# Patient Record
Sex: Female | Born: 1940 | Race: White | Hispanic: No | Marital: Married | Smoking: Never smoker
Health system: Southern US, Community
[De-identification: ages and names within clinical notes are randomized; demographics above are authoritative.]

## PROBLEM LIST (undated history)

## (undated) DIAGNOSIS — I10 Essential (primary) hypertension: Secondary | ICD-10-CM

## (undated) DIAGNOSIS — F419 Anxiety disorder, unspecified: Secondary | ICD-10-CM

## (undated) DIAGNOSIS — M545 Low back pain, unspecified: Secondary | ICD-10-CM

## (undated) DIAGNOSIS — M1990 Unspecified osteoarthritis, unspecified site: Secondary | ICD-10-CM

## (undated) DIAGNOSIS — K219 Gastro-esophageal reflux disease without esophagitis: Secondary | ICD-10-CM

## (undated) DIAGNOSIS — N200 Calculus of kidney: Secondary | ICD-10-CM

## (undated) DIAGNOSIS — Z8719 Personal history of other diseases of the digestive system: Secondary | ICD-10-CM

## (undated) DIAGNOSIS — Z8711 Personal history of peptic ulcer disease: Secondary | ICD-10-CM

## (undated) DIAGNOSIS — I639 Cerebral infarction, unspecified: Secondary | ICD-10-CM

## (undated) DIAGNOSIS — K529 Noninfective gastroenteritis and colitis, unspecified: Secondary | ICD-10-CM

## (undated) DIAGNOSIS — J3800 Paralysis of vocal cords and larynx, unspecified: Secondary | ICD-10-CM

## (undated) DIAGNOSIS — E039 Hypothyroidism, unspecified: Secondary | ICD-10-CM

## (undated) DIAGNOSIS — M797 Fibromyalgia: Secondary | ICD-10-CM

## (undated) DIAGNOSIS — C44321 Squamous cell carcinoma of skin of nose: Secondary | ICD-10-CM

## (undated) DIAGNOSIS — G43909 Migraine, unspecified, not intractable, without status migrainosus: Secondary | ICD-10-CM

## (undated) DIAGNOSIS — A048 Other specified bacterial intestinal infections: Secondary | ICD-10-CM

## (undated) DIAGNOSIS — G8929 Other chronic pain: Secondary | ICD-10-CM

## (undated) DIAGNOSIS — M069 Rheumatoid arthritis, unspecified: Secondary | ICD-10-CM

## (undated) HISTORY — PX: KYPHOPLASTY: SHX5884

## (undated) HISTORY — DX: Other specified bacterial intestinal infections: A04.8

## (undated) HISTORY — PX: EXCISIONAL HEMORRHOIDECTOMY: SHX1541

## (undated) HISTORY — PX: TONSILLECTOMY: SUR1361

## (undated) HISTORY — PX: LUMBAR DISC SURGERY: SHX700

## (undated) HISTORY — PX: SQUAMOUS CELL CARCINOMA EXCISION: SHX2433

## (undated) HISTORY — PX: LAPAROSCOPIC CHOLECYSTECTOMY: SUR755

## (undated) HISTORY — PX: CATARACT EXTRACTION W/ INTRAOCULAR LENS  IMPLANT, BILATERAL: SHX1307

## (undated) HISTORY — PX: ABDOMINAL HYSTERECTOMY: SHX81

## (undated) HISTORY — DX: Paralysis of vocal cords and larynx, unspecified: J38.00

## (undated) HISTORY — PX: LITHOTRIPSY: SUR834

---

## 2011-02-22 DIAGNOSIS — E559 Vitamin D deficiency, unspecified: Secondary | ICD-10-CM | POA: Insufficient documentation

## 2011-08-23 DIAGNOSIS — M810 Age-related osteoporosis without current pathological fracture: Secondary | ICD-10-CM | POA: Insufficient documentation

## 2012-03-01 ENCOUNTER — Emergency Department (HOSPITAL_BASED_OUTPATIENT_CLINIC_OR_DEPARTMENT_OTHER): Payer: Medicare Other

## 2012-03-01 ENCOUNTER — Encounter (HOSPITAL_BASED_OUTPATIENT_CLINIC_OR_DEPARTMENT_OTHER): Payer: Self-pay | Admitting: *Deleted

## 2012-03-01 ENCOUNTER — Emergency Department (HOSPITAL_BASED_OUTPATIENT_CLINIC_OR_DEPARTMENT_OTHER)
Admission: EM | Admit: 2012-03-01 | Discharge: 2012-03-01 | Disposition: A | Discharge status: Home / Self Care | Payer: Medicare Other | Attending: Emergency Medicine | Admitting: Emergency Medicine

## 2012-03-01 DIAGNOSIS — Y929 Unspecified place or not applicable: Secondary | ICD-10-CM | POA: Insufficient documentation

## 2012-03-01 DIAGNOSIS — E079 Disorder of thyroid, unspecified: Secondary | ICD-10-CM | POA: Insufficient documentation

## 2012-03-01 DIAGNOSIS — K5289 Other specified noninfective gastroenteritis and colitis: Secondary | ICD-10-CM | POA: Insufficient documentation

## 2012-03-01 DIAGNOSIS — M1990 Unspecified osteoarthritis, unspecified site: Secondary | ICD-10-CM | POA: Insufficient documentation

## 2012-03-01 DIAGNOSIS — Z79899 Other long term (current) drug therapy: Secondary | ICD-10-CM | POA: Insufficient documentation

## 2012-03-01 DIAGNOSIS — S93609A Unspecified sprain of unspecified foot, initial encounter: Secondary | ICD-10-CM

## 2012-03-01 DIAGNOSIS — I10 Essential (primary) hypertension: Secondary | ICD-10-CM | POA: Insufficient documentation

## 2012-03-01 DIAGNOSIS — M069 Rheumatoid arthritis, unspecified: Secondary | ICD-10-CM | POA: Insufficient documentation

## 2012-03-01 DIAGNOSIS — X500XXA Overexertion from strenuous movement or load, initial encounter: Secondary | ICD-10-CM | POA: Insufficient documentation

## 2012-03-01 DIAGNOSIS — Y9389 Activity, other specified: Secondary | ICD-10-CM | POA: Insufficient documentation

## 2012-03-01 HISTORY — DX: Unspecified osteoarthritis, unspecified site: M19.90

## 2012-03-01 HISTORY — DX: Rheumatoid arthritis, unspecified: M06.9

## 2012-03-01 HISTORY — DX: Essential (primary) hypertension: I10

## 2012-03-01 HISTORY — DX: Noninfective gastroenteritis and colitis, unspecified: K52.9

## 2012-03-01 MED ORDER — ACETAMINOPHEN 325 MG PO TABS
650.0000 mg | ORAL_TABLET | Freq: Once | ORAL | Status: AC
  Administered 2012-03-01: 650 mg via ORAL
  Filled 2012-03-01: qty 2

## 2012-03-01 MED ORDER — HYDROCODONE-ACETAMINOPHEN 5-500 MG PO TABS: 1.0000 | ORAL_TABLET | Freq: Four times a day (QID) | ORAL | Status: DC | PRN

## 2012-03-01 NOTE — ED Notes (Signed)
Patient states she steped down off a ladder and twisted her left ankle. Continued to hurt, hurts to bear weight. Took tylenol around 3am.

## 2012-03-01 NOTE — ED Provider Notes (Signed)
History     CSN: [id number]  Arrival date & time 03/01/12  4098   First MD Initiated Contact with Patient 03/01/12 [phone]      Chief Complaint  Patient presents with  . Ankle Pain    (Consider location/radiation/quality/duration/timing/severity/associated sxs/prior treatment) HPI Comments: Patient presents with pain and swelling to the left foot. She states she was stepping off a ladder and when she stepped down onto her left foot she felt a slight pop in the lateral aspect of the foot. This happened yesterday and she's had constant throbbing pain since then. She's unable to bear weight. She denies any other injuries. Denies any past injuries to that foot. She took a Tylenol earlier this morning with some relief.  Patient is a 71 y.o. female presenting with ankle pain.  Ankle Pain  Pertinent negatives include no numbness.    Past Medical History  Diagnosis Date  . Thyroid disease   . Hypertension   . Osteoarthritis   . Colitis   . Rheumatoid arthritis     Past Surgical History  Procedure Date  . Tonsillectomy   . Cholecystectomy   . Abdominal hysterectomy   . Back surgery   . Cesarean section     No family history on file.  History  Substance Use Topics  . Smoking status: Never Smoker   . Smokeless tobacco: Not on file  . Alcohol Use: No    OB History    Grav Para Term Preterm Abortions TAB SAB Ect Mult Living                  Review of Systems  Constitutional: Negative for fever.  HENT: Negative for neck pain.   Gastrointestinal: Negative for nausea and vomiting.  Musculoskeletal: Negative for back pain.  Skin: Negative for wound.  Neurological: Negative for weakness, numbness and headaches.    Allergies  Ciprofloxacin; Leflunomide; Macrodantin; Methotrexate derivatives; Penicillins; and Tetanus toxoids  Home Medications   Current Outpatient Rx  Name Route Sig Dispense Refill  . CEPHALEXIN 250 MG PO CAPS Oral Take 250 mg by mouth daily.    Marland Kitchen  VITAMIN D 1000 UNITS PO TABS Oral Take 2,000 Units by mouth daily.    Marland Kitchen DIAZEPAM 5 MG PO TABS Oral Take 5 mg by mouth every 6 (six) hours as needed.    Marland Kitchen ETANERCEPT 50 MG/ML [place] SOLN Subcutaneous Inject 50 mg into the skin once a week.    Marland Kitchen FOLIC ACID-VIT B6-VIT B12 2.2-25-0.5 MG PO TABS Oral Take by mouth.    Marland Kitchen HYDROCODONE-ACETAMINOPHEN 5-500 MG PO CAPS Oral Take 1 capsule by mouth every 6 (six) hours as needed.    Marland Kitchen HYDROXYCHLOROQUINE SULFATE 200 MG PO TABS Oral Take by mouth daily.    Marland Kitchen LEVOTHYROXINE SODIUM 75 MCG PO TABS Oral Take 75 mcg by mouth daily.    Marland Kitchen METOPROLOL TARTRATE 100 MG PO TABS Oral Take 100 mg by mouth daily.    . OXYCODONE-ACETAMINOPHEN 7.5-325 MG PO TABS Oral Take 1 tablet by mouth every 4 (four) hours as needed.    Marland Kitchen PANTOPRAZOLE SODIUM 40 MG PO TBEC Oral Take 40 mg by mouth daily.    Marland Kitchen RIFAXIMIN 200 MG PO TABS Oral Take 200 mg by mouth 3 (three) times daily.    Marland Kitchen ZOLEDRONIC ACID 5 MG/100ML IV SOLN Intravenous Inject 5 mg into the vein once.    Marland Kitchen HYDROCODONE-ACETAMINOPHEN 5-500 MG PO TABS Oral Take 1-2 tablets by mouth every 6 (six) hours as needed  for pain. 15 tablet 0    BP 126/67  Pulse 56  Temp 97.8 F (36.6 C) (Oral)  Resp 14  Ht 4\' 11"  (1.499 m)  Wt 109 lb (49.442 kg)  BMI 22.02 kg/m2  SpO2 100%  Physical Exam  Constitutional: She is oriented to person, place, and time. She appears well-developed and well-nourished.  HENT:  Head: Normocephalic and atraumatic.  Cardiovascular: Normal rate.   Pulmonary/Chest: Effort normal. No respiratory distress. She has no wheezes. She has no rales. She exhibits no tenderness.  Abdominal: There is no tenderness. There is no rebound and no guarding.  Musculoskeletal: Normal range of motion. She exhibits no edema.       Patient with moderate tenderness along the left fifth metatarsal. There is no pain to the ankle. No pain in the proximal fibula. She has normal sensation in the foot. Normal motor function in the foot.  Pulses in the foot are intact  Neurological: She is alert and oriented to person, place, and time.  Skin: Skin is warm and dry.  Psychiatric: She has a normal mood and affect.    ED Course  Procedures (including critical care time)  No results found for this or any previous visit. Dg Foot Complete Left  03/01/2012  *RADIOLOGY REPORT*  Clinical Data: 71 year old female with left foot pain following injury.  LEFT FOOT - COMPLETE 3+ VIEW  Comparison: None  Findings: There is no evidence of acute fracture, subluxation, or dislocation. The Lisfranc joints are intact. A moderate calcaneal spur is present. No other focal bony abnormalities are present. There is no evidence of radiopaque foreign body.  The joint spaces are unremarkable.  IMPRESSION: No evidence of acute abnormality.  Calcaneal spur.   Original Report Authenticated By: Rosendo Gros, M.D.       1. Foot sprain       MDM  No evidence of fracture.  Likely sprain.  Will place in post op shoe, has walker at home.  F/u with her PMD next week for recheck        Rolan Bucco, MD 03/01/12 [phone]

## 2012-03-10 DIAGNOSIS — E538 Deficiency of other specified B group vitamins: Secondary | ICD-10-CM | POA: Insufficient documentation

## 2012-03-18 DIAGNOSIS — D179 Benign lipomatous neoplasm, unspecified: Secondary | ICD-10-CM | POA: Insufficient documentation

## 2012-03-18 DIAGNOSIS — Z139 Encounter for screening, unspecified: Secondary | ICD-10-CM | POA: Insufficient documentation

## 2012-03-18 DIAGNOSIS — N182 Chronic kidney disease, stage 2 (mild): Secondary | ICD-10-CM | POA: Insufficient documentation

## 2012-03-18 DIAGNOSIS — D709 Neutropenia, unspecified: Secondary | ICD-10-CM | POA: Insufficient documentation

## 2014-06-03 DIAGNOSIS — N281 Cyst of kidney, acquired: Secondary | ICD-10-CM | POA: Insufficient documentation

## 2014-06-03 DIAGNOSIS — Z87442 Personal history of urinary calculi: Secondary | ICD-10-CM | POA: Insufficient documentation

## 2014-08-16 DIAGNOSIS — Z79899 Other long term (current) drug therapy: Secondary | ICD-10-CM | POA: Insufficient documentation

## 2014-09-01 ENCOUNTER — Inpatient Hospital Stay (HOSPITAL_BASED_OUTPATIENT_CLINIC_OR_DEPARTMENT_OTHER)
Admission: EM | Admit: 2014-09-01 | Discharge: 2014-09-02 | DRG: 641 | Disposition: A | Discharge status: Home Health | Payer: Medicare Other | Attending: Internal Medicine | Admitting: Internal Medicine

## 2014-09-01 ENCOUNTER — Emergency Department (HOSPITAL_BASED_OUTPATIENT_CLINIC_OR_DEPARTMENT_OTHER): Payer: Medicare Other

## 2014-09-01 ENCOUNTER — Encounter (HOSPITAL_BASED_OUTPATIENT_CLINIC_OR_DEPARTMENT_OTHER): Payer: Self-pay | Admitting: Emergency Medicine

## 2014-09-01 ENCOUNTER — Inpatient Hospital Stay (HOSPITAL_COMMUNITY): Payer: Medicare Other

## 2014-09-01 DIAGNOSIS — E860 Dehydration: Secondary | ICD-10-CM | POA: Diagnosis not present

## 2014-09-01 DIAGNOSIS — M069 Rheumatoid arthritis, unspecified: Secondary | ICD-10-CM | POA: Diagnosis present

## 2014-09-01 DIAGNOSIS — E872 Acidosis, unspecified: Secondary | ICD-10-CM | POA: Diagnosis present

## 2014-09-01 DIAGNOSIS — Z79899 Other long term (current) drug therapy: Secondary | ICD-10-CM

## 2014-09-01 DIAGNOSIS — M810 Age-related osteoporosis without current pathological fracture: Secondary | ICD-10-CM | POA: Diagnosis present

## 2014-09-01 DIAGNOSIS — K5900 Constipation, unspecified: Secondary | ICD-10-CM

## 2014-09-01 DIAGNOSIS — E039 Hypothyroidism, unspecified: Secondary | ICD-10-CM | POA: Diagnosis present

## 2014-09-01 DIAGNOSIS — R4182 Altered mental status, unspecified: Secondary | ICD-10-CM | POA: Diagnosis present

## 2014-09-01 DIAGNOSIS — R627 Adult failure to thrive: Secondary | ICD-10-CM | POA: Diagnosis not present

## 2014-09-01 DIAGNOSIS — I10 Essential (primary) hypertension: Secondary | ICD-10-CM | POA: Diagnosis present

## 2014-09-01 DIAGNOSIS — E038 Other specified hypothyroidism: Secondary | ICD-10-CM | POA: Diagnosis not present

## 2014-09-01 DIAGNOSIS — N179 Acute kidney failure, unspecified: Secondary | ICD-10-CM | POA: Diagnosis present

## 2014-09-01 DIAGNOSIS — R6251 Failure to thrive (child): Secondary | ICD-10-CM | POA: Diagnosis present

## 2014-09-01 DIAGNOSIS — E162 Hypoglycemia, unspecified: Secondary | ICD-10-CM

## 2014-09-01 HISTORY — DX: Low back pain: M54.5

## 2014-09-01 HISTORY — DX: Squamous cell carcinoma of skin of nose: C44.321

## 2014-09-01 HISTORY — DX: Other chronic pain: G89.29

## 2014-09-01 HISTORY — DX: Personal history of peptic ulcer disease: Z87.11

## 2014-09-01 HISTORY — DX: Anxiety disorder, unspecified: F41.9

## 2014-09-01 HISTORY — DX: Hypothyroidism, unspecified: E03.9

## 2014-09-01 HISTORY — DX: Fibromyalgia: M79.7

## 2014-09-01 HISTORY — DX: Personal history of other diseases of the digestive system: Z87.19

## 2014-09-01 HISTORY — DX: Cerebral infarction, unspecified: I63.9

## 2014-09-01 HISTORY — DX: Migraine, unspecified, not intractable, without status migrainosus: G43.909

## 2014-09-01 HISTORY — DX: Gastro-esophageal reflux disease without esophagitis: K21.9

## 2014-09-01 HISTORY — DX: Calculus of kidney: N20.0

## 2014-09-01 HISTORY — DX: Low back pain, unspecified: M54.50

## 2014-09-01 LAB — CBC WITH DIFFERENTIAL/PLATELET
BASOS PCT: 0 % (ref 0–1)
Basophils Absolute: 0 10*3/uL (ref 0.0–0.1)
EOS ABS: 0.1 10*3/uL (ref 0.0–0.7)
EOS PCT: 1 % (ref 0–5)
HCT: 38.5 % (ref 36.0–46.0)
HEMOGLOBIN: 13.6 g/dL (ref 12.0–15.0)
Lymphocytes Relative: 47 % — ABNORMAL HIGH (ref 12–46)
Lymphs Abs: 3.9 10*3/uL (ref 0.7–4.0)
MCH: 33.2 pg (ref 26.0–34.0)
MCHC: 35.3 g/dL (ref 30.0–36.0)
MCV: 93.9 fL (ref 78.0–100.0)
MONO ABS: 1.3 10*3/uL — AB (ref 0.1–1.0)
MONOS PCT: 15 % — AB (ref 3–12)
NEUTROS PCT: 37 % — AB (ref 43–77)
Neutro Abs: 3.1 10*3/uL (ref 1.7–7.7)
Platelets: 210 10*3/uL (ref 150–400)
RBC: 4.1 MIL/uL (ref 3.87–5.11)
RDW: 12.9 % (ref 11.5–15.5)
WBC: 8.4 10*3/uL (ref 4.0–10.5)

## 2014-09-01 LAB — COMPREHENSIVE METABOLIC PANEL
ALBUMIN: 4.3 g/dL (ref 3.5–5.2)
ALK PHOS: 38 U/L — AB (ref 39–117)
ALT: 20 U/L (ref 0–35)
ANION GAP: 17 — AB (ref 5–15)
AST: 25 U/L (ref 0–37)
BUN: 21 mg/dL (ref 6–23)
CALCIUM: 9.2 mg/dL (ref 8.4–10.5)
CO2: 13 mmol/L — ABNORMAL LOW (ref 19–32)
CREATININE: 1.28 mg/dL — AB (ref 0.50–1.10)
Chloride: 109 mmol/L (ref 96–112)
GFR calc Af Amer: 47 mL/min — ABNORMAL LOW (ref >90)
GFR calc non Af Amer: 40 mL/min — ABNORMAL LOW (ref >90)
Glucose, Bld: 67 mg/dL — ABNORMAL LOW (ref 70–99)
POTASSIUM: 4.4 mmol/L (ref 3.5–5.1)
SODIUM: 139 mmol/L (ref 135–145)
TOTAL PROTEIN: 7.5 g/dL (ref 6.0–8.3)
Total Bilirubin: 1.1 mg/dL (ref 0.3–1.2)

## 2014-09-01 LAB — URINALYSIS, ROUTINE W REFLEX MICROSCOPIC
Glucose, UA: 500 mg/dL — AB
Hgb urine dipstick: NEGATIVE
LEUKOCYTES UA: NEGATIVE
NITRITE: NEGATIVE
PROTEIN: 30 mg/dL — AB
Specific Gravity, Urine: 1.02 (ref 1.005–1.030)
UROBILINOGEN UA: 0.2 mg/dL (ref 0.0–1.0)
pH: 5.5 (ref 5.0–8.0)

## 2014-09-01 LAB — I-STAT VENOUS BLOOD GAS, ED
Acid-base deficit: 10 mmol/L — ABNORMAL HIGH (ref 0.0–2.0)
Bicarbonate: 15.1 mEq/L — ABNORMAL LOW (ref 20.0–24.0)
O2 SAT: 48 %
PCO2 VEN: 30.2 mmHg — AB (ref 45.0–50.0)
Patient temperature: 98.4
TCO2: 16 mmol/L (ref 0–100)
pH, Ven: 7.306 — ABNORMAL HIGH (ref 7.250–7.300)
pO2, Ven: 28 mmHg — CL (ref 30.0–45.0)

## 2014-09-01 LAB — TSH: TSH: 0.026 u[IU]/mL — AB (ref 0.350–4.500)

## 2014-09-01 LAB — URINE MICROSCOPIC-ADD ON

## 2014-09-01 LAB — RETICULOCYTES
RBC.: 3.78 MIL/uL — ABNORMAL LOW (ref 3.87–5.11)
RETIC COUNT ABSOLUTE: 22.7 10*3/uL (ref 19.0–186.0)
Retic Ct Pct: 0.6 % (ref 0.4–3.1)

## 2014-09-01 LAB — I-STAT CG4 LACTIC ACID, ED: Lactic Acid, Venous: 0.66 mmol/L (ref 0.5–2.0)

## 2014-09-01 LAB — CBG MONITORING, ED
GLUCOSE-CAPILLARY: 121 mg/dL — AB (ref 70–99)
Glucose-Capillary: 58 mg/dL — ABNORMAL LOW (ref 70–99)

## 2014-09-01 MED ORDER — PANTOPRAZOLE SODIUM 40 MG PO TBEC
40.0000 mg | DELAYED_RELEASE_TABLET | Freq: Every day | ORAL | Status: DC
Start: 2014-09-01 — End: 2014-09-02
  Administered 2014-09-01 – 2014-09-02 (×2): 40 mg via ORAL
  Filled 2014-09-01 (×2): qty 1

## 2014-09-01 MED ORDER — BOOST PLUS PO LIQD
237.0000 mL | Freq: Three times a day (TID) | ORAL | Status: DC
  Administered 2014-09-01 – 2014-09-02 (×3): 237 mL via ORAL
  Filled 2014-09-01 (×8): qty 237

## 2014-09-01 MED ORDER — DOCUSATE SODIUM 100 MG PO CAPS
100.0000 mg | ORAL_CAPSULE | Freq: Two times a day (BID) | ORAL | Status: DC
  Administered 2014-09-01 – 2014-09-02 (×2): 100 mg via ORAL
  Filled 2014-09-01 (×3): qty 1

## 2014-09-01 MED ORDER — THIAMINE HCL 100 MG/ML IJ SOLN
100.0000 mg | Freq: Every day | INTRAMUSCULAR | Status: DC
  Administered 2014-09-01: 100 mg via INTRAVENOUS
  Filled 2014-09-01 (×2): qty 1

## 2014-09-01 MED ORDER — DEXTROSE-NACL 5-0.9 % IV SOLN
Freq: Once | INTRAVENOUS | Status: AC
  Administered 2014-09-01: 1000 mL via INTRAVENOUS

## 2014-09-01 MED ORDER — ONDANSETRON HCL 4 MG/2ML IJ SOLN: 4.0000 mg | Freq: Four times a day (QID) | INTRAMUSCULAR | Status: DC | PRN

## 2014-09-01 MED ORDER — SODIUM CHLORIDE 0.9 % IV SOLN
INTRAVENOUS | Status: DC
  Administered 2014-09-01 – 2014-09-02 (×2): via INTRAVENOUS

## 2014-09-01 MED ORDER — LEVOTHYROXINE SODIUM 75 MCG PO TABS
75.0000 ug | ORAL_TABLET | Freq: Every day | ORAL | Status: DC
  Administered 2014-09-02: 75 ug via ORAL
  Filled 2014-09-01 (×2): qty 1

## 2014-09-01 MED ORDER — BOOST / RESOURCE BREEZE PO LIQD
1.0000 | Freq: Three times a day (TID) | ORAL | Status: DC
  Administered 2014-09-01 – 2014-09-02 (×2): 1 via ORAL

## 2014-09-01 MED ORDER — ENOXAPARIN SODIUM 40 MG/0.4ML ~~LOC~~ SOLN: 40.0000 mg | SUBCUTANEOUS | Status: DC

## 2014-09-01 MED ORDER — DEXTROSE 50 % IV SOLN
INTRAVENOUS | Status: AC
  Administered 2014-09-01: 50 mL
  Filled 2014-09-01: qty 50

## 2014-09-01 MED ORDER — RIFAXIMIN 200 MG PO TABS
200.0000 mg | ORAL_TABLET | Freq: Three times a day (TID) | ORAL | Status: DC
  Administered 2014-09-01 – 2014-09-02 (×3): 200 mg via ORAL
  Filled 2014-09-01 (×5): qty 1

## 2014-09-01 MED ORDER — METOPROLOL TARTRATE 100 MG PO TABS: 100.0000 mg | ORAL_TABLET | Freq: Every day | ORAL | Status: DC

## 2014-09-01 MED ORDER — ONDANSETRON HCL 4 MG PO TABS: 4.0000 mg | ORAL_TABLET | Freq: Four times a day (QID) | ORAL | Status: DC | PRN

## 2014-09-01 MED ORDER — DEXTROSE-NACL 5-0.9 % IV SOLN: Freq: Once | INTRAVENOUS | Status: DC

## 2014-09-01 MED ORDER — HEPARIN SODIUM (PORCINE) 5000 UNIT/ML IJ SOLN
5000.0000 [IU] | Freq: Three times a day (TID) | INTRAMUSCULAR | Status: DC
  Administered 2014-09-01 – 2014-09-02 (×4): 5000 [IU] via SUBCUTANEOUS
  Filled 2014-09-01 (×5): qty 1

## 2014-09-01 MED ORDER — HYDROXYCHLOROQUINE SULFATE 200 MG PO TABS
200.0000 mg | ORAL_TABLET | Freq: Every day | ORAL | Status: DC
  Administered 2014-09-02: 200 mg via ORAL
  Filled 2014-09-01: qty 1

## 2014-09-01 MED ORDER — FLEET ENEMA 7-19 GM/118ML RE ENEM
1.0000 | ENEMA | Freq: Once | RECTAL | Status: AC | PRN
  Filled 2014-09-01: qty 1

## 2014-09-01 MED ORDER — BISACODYL 5 MG PO TBEC: 5.0000 mg | DELAYED_RELEASE_TABLET | Freq: Every day | ORAL | Status: DC | PRN

## 2014-09-01 MED ORDER — DIAZEPAM 5 MG PO TABS: 5.0000 mg | ORAL_TABLET | Freq: Four times a day (QID) | ORAL | Status: DC | PRN

## 2014-09-01 MED ORDER — ALUM & MAG HYDROXIDE-SIMETH 200-200-20 MG/5ML PO SUSP: 30.0000 mL | Freq: Four times a day (QID) | ORAL | Status: DC | PRN

## 2014-09-01 MED ORDER — HYDROCODONE-ACETAMINOPHEN 5-325 MG PO TABS: 1.0000 | ORAL_TABLET | ORAL | Status: DC | PRN

## 2014-09-01 MED ORDER — POLYETHYLENE GLYCOL 3350 17 G PO PACK
17.0000 g | PACK | Freq: Every day | ORAL | Status: DC | PRN
  Filled 2014-09-01: qty 1

## 2014-09-01 MED ORDER — FOLIC ACID 5 MG/ML IJ SOLN
1.0000 mg | Freq: Every day | INTRAMUSCULAR | Status: DC
  Administered 2014-09-01: 1 mg via INTRAVENOUS
  Filled 2014-09-01 (×2): qty 0.2

## 2014-09-01 NOTE — ED Notes (Signed)
Report given to [facility] RN with [facility].

## 2014-09-01 NOTE — ED Notes (Signed)
Pt family increased confusion and increased weakness,  Decreased po intake

## 2014-09-01 NOTE — ED Notes (Signed)
Patient has decreased mentation and is more "confused" than she was when she saw her Dr. This afternoon. The patient is still not eating and she is more tired.

## 2014-09-01 NOTE — H&P (Signed)
[facility] History and Physical                                                                                    Ann Gonzales, is a 74 y.o. female  MRN: [id number]   DOB - 12-02-1940  Admit Date - 09/01/2014  Outpatient Primary MD for the patient is No primary care provider on file.  With History of -  Past Medical History  Diagnosis Date  . Thyroid disease   . Hypertension   . Osteoarthritis   . Colitis   . Rheumatoid arthritis(714.0)       Past Surgical History  Procedure Laterality Date  . Tonsillectomy    . Cholecystectomy    . Abdominal hysterectomy    . Back surgery    . Cesarean section      in for   Chief Complaint  Patient presents with  . Altered Mental Status     HPI This is a 74 year old female patient referred for admission to [facility], and hospital by Dr. Ardith Dark emergency room physician. This patient has a past medical history of hypothyroidism, hypertension, rheumatoid arthritis who presents with progressive weakness for the past 3 weeks. Husband at bedside and clarifies she is actually had extensive progressive decline in her ability to perform IADLs and ADLs for at least one year. Patient has chronic pain related to rheumatoid arthritis as well as chronic low back pain. About 3 weeks ago she received an infusion of her typical rheumatoid arthritis medication and quit eating. The patient reports that the food is not appealing although she does like sweets but then doesn't eat a lot of those either. She has been attempting to utilize boost milk. She denies any abdominal pain or reflux symptoms. No blood or dark stools. She does have issues with intermittent loose stools and has been evaluated by GI for this in the past and is on rifaximin. She was recently taken off re-class for her osteoporosis due to dental issues. Unable to quantify exact amount of weight patient has lost.  Evaluation in the ER revealed normal lactate bicarbonate 13 creatinine 1.3  with baseline laboratory data unknown. Chest x-ray was unremarkable as was EKG. In addition to the above patient denied any recent upper respiratory infection symptoms or urinary tract infection symptoms or recent treatment with antibiotics for any cause.  Review of Systems   In addition to the HPI above,  No Fever-chills, myalgias or other constitutional symptoms No Headache, changes with Vision or hearing, new weakness, tingling, numbness in any extremity, No problems swallowing food or Liquids, indigestion/reflux-reports food is not appealing but does not have any difficulty eating or swallowing No Chest pain, Cough or Shortness of Breath, palpitations, orthopnea or DOE No Abdominal pain, N/V; no melena or hematochezia, no dark tarry stools, Bowel movements are regular, No dysuria, hematuria or flank pain No new skin rashes, lesions, masses or bruises, No new joints pains-aches-has chronic back and joint pain related to rheumatoid arthritis No polyuria, polydypsia or polyphagia,  *A full 10 point Review of Systems was done, except as stated above, all other Review of Systems were negative.  Social History History  Substance  Use Topics  . Smoking status: Never Smoker   . Smokeless tobacco: Not on file  . Alcohol Use: No    Family History History reviewed. No pertinent family history.  Prior to Admission medications   Medication Sig Start Date End Date Taking? Authorizing Provider  cephALEXin (KEFLEX) 250 MG capsule Take 250 mg by mouth daily.    Historical Provider, MD  cholecalciferol (VITAMIN D) 1000 UNITS tablet Take 2,000 Units by mouth daily.    Historical Provider, MD  diazepam (VALIUM) 5 MG tablet Take 5 mg by mouth every 6 (six) hours as needed.    Historical Provider, MD  etanercept (ENBREL) 50 MG/ML injection Inject 50 mg into the skin once a week.    Historical Provider, MD  Folic Acid-Vit N9-GXQ J19 (FOLPLEX 2.2) 2.2-25-0.5 MG TABS Take by mouth.    Historical Provider,  MD  hydrocodone-acetaminophen (LORCET-HD) 5-500 MG per capsule Take 1 capsule by mouth every 6 (six) hours as needed.    Historical Provider, MD  HYDROcodone-acetaminophen (VICODIN) 5-500 MG per tablet Take 1-2 tablets by mouth every 6 (six) hours as needed for pain. 03/01/12   Malvin Johns, MD  hydroxychloroquine (PLAQUENIL) 200 MG tablet Take by mouth daily.    Historical Provider, MD  levothyroxine (SYNTHROID, LEVOTHROID) 75 MCG tablet Take 75 mcg by mouth daily.    Historical Provider, MD  metoprolol (LOPRESSOR) 100 MG tablet Take 100 mg by mouth daily.    Historical Provider, MD  oxyCODONE-acetaminophen (PERCOCET) 7.5-325 MG per tablet Take 1 tablet by mouth every 4 (four) hours as needed.    Historical Provider, MD  pantoprazole (PROTONIX) 40 MG tablet Take 40 mg by mouth daily.    Historical Provider, MD  rifaximin (XIFAXAN) 200 MG tablet Take 200 mg by mouth 3 (three) times daily.    Historical Provider, MD  zoledronic acid (RECLAST) 5 MG/100ML SOLN Inject 5 mg into the vein once.    Historical Provider, MD    Allergies  Allergen Reactions  . Ciprofloxacin     Doesn't remember   . Leflunomide   . Macrodantin [Nitrofurantoin Macrocrystal]     Doesn't remember   . Methotrexate Derivatives   . Penicillins   . Tetanus Toxoids     Physical Exam  Vitals  Blood pressure 114/53, pulse 70, temperature 98.2 F (36.8 C), temperature source Oral, resp. rate 16, weight 91 lb (41.277 kg), SpO2 100 %.   General:  In no acute distress, appears frail and underweight, pale  Psych:  Normal affect, Denies Suicidal or Homicidal ideations, Awake Alert, Oriented X 3. Speech and thought patterns are clear and appropriate, no apparent short term memory deficits  Neuro:   No focal neurological deficits, CN II through XII intact, Strength 5/5 all 4 extremities, Sensation intact all 4 extremities.  ENT:  Ears and Eyes appear Normal, Conjunctivae clear, PER. Moist oral mucosa without erythema or  exudates.  Neck:  Supple, No lymphadenopathy appreciated  Respiratory:  Symmetrical chest wall movement, Good air movement bilaterally, CTAB. Room Air  Cardiac:  RRR, No Murmurs, no LE edema noted, no JVD, No carotid bruits, peripheral pulses palpable at 2+  Abdomen:  Positive bowel sounds, Soft, mildly tender over suprapubic region, Non distended,  No masses appreciated, no obvious hepatosplenomegaly  Skin:  No Cyanosis, poor Skin Turgor, No Skin Rash or Bruise. Pale  Extremities: Symmetrical without obvious trauma or injury,  no effusions.  Data Review  CBC  Recent Labs Lab 09/01/14 0450  WBC 8.4  HGB 13.6  HCT 38.5  PLT 210  MCV 93.9  MCH 33.2  MCHC 35.3  RDW 12.9  LYMPHSABS 3.9  MONOABS 1.3*  EOSABS 0.1  BASOSABS 0.0    Chemistries   Recent Labs Lab 09/01/14 0450  NA 139  K 4.4  CL 109  CO2 13*  GLUCOSE 67*  BUN 21  CREATININE 1.28*  CALCIUM 9.2  AST 25  ALT 20  ALKPHOS 38*  BILITOT 1.1    CrCl cannot be calculated (Unknown ideal weight.).  No results for input(s): TSH, T4TOTAL, T3FREE, THYROIDAB in the last 72 hours.  Invalid input(s): FREET3  Coagulation profile No results for input(s): INR, PROTIME in the last 168 hours.  No results for input(s): DDIMER in the last 72 hours.  Cardiac Enzymes No results for input(s): CKMB, TROPONINI, MYOGLOBIN in the last 168 hours.  Invalid input(s): CK  Invalid input(s): POCBNP  Urinalysis    Component Value Date/Time   COLORURINE YELLOW 09/01/2014 0515   APPEARANCEUR CLEAR 09/01/2014 0515   LABSPEC 1.020 09/01/2014 0515   PHURINE 5.5 09/01/2014 0515   GLUCOSEU 500* 09/01/2014 0515   HGBUR NEGATIVE 09/01/2014 0515   BILIRUBINUR MODERATE* 09/01/2014 0515   KETONESUR >80* 09/01/2014 0515   PROTEINUR 30* 09/01/2014 0515   UROBILINOGEN 0.2 09/01/2014 0515   NITRITE NEGATIVE 09/01/2014 0515   LEUKOCYTESUR NEGATIVE 09/01/2014 0515    Imaging results:   Ct Head Wo Contrast  09/01/2014    CLINICAL DATA:  Acute onset of altered mental status. Confusion. Initial encounter.  EXAM: CT HEAD WITHOUT CONTRAST  TECHNIQUE: Contiguous axial images were obtained from the base of the skull through the vertex without intravenous contrast.  COMPARISON:  None.  FINDINGS: There is no evidence of acute infarction, mass lesion, or intra- or extra-axial hemorrhage on CT.  Mild periventricular white matter change likely reflects small vessel ischemic microangiopathy.  The posterior fossa, including the cerebellum, brainstem and fourth ventricle, is within normal limits. The third and lateral ventricles, and basal ganglia are unremarkable in appearance. The cerebral hemispheres are symmetric in appearance, with normal gray-white differentiation. No mass effect or midline shift is seen.  There is no evidence of fracture; visualized osseous structures are unremarkable in appearance. The visualized portions of the orbits are within normal limits. The paranasal sinuses and mastoid air cells are well-aerated. No significant soft tissue abnormalities are seen.  IMPRESSION: 1. No acute intracranial pathology seen on CT. 2. Mild small vessel ischemic microangiopathy.   Electronically Signed   By: Garald Balding M.D.   On: 09/01/2014 07:00     EKG: Sinus rhythm with nonspecific T-wave abnormality    Assessment & Plan  Principal Problem:   Failure to thrive in adult -Admit to telemetry -Etiology uncertain at this point: Suspect possibly mediated by ongoing chronic pain-likely has underlying constipation in setting of dysmotility and chronic narcotic medication use -PT/OT evaluation -Check TSH and anemia panel -Check portable abdomen to rule out chronic constipation is contributory -Give IV thiamine and folate -Protein supplementation with boost and resource -May benefit from nutritional evaluation  Active Problems:   Dehydration -Appears fine depleted based on laboratory data and clinical exam -IV  fluids -Follow labs    Metabolic acidosis -Likely from dehydration and mild acute renal failure    Hypothyroidism -Continue previous admission Synthroid -Check TSH    Rheumatoid arthritis -Receives outpatient medication weekly    Hypertension -Blood pressure per soft -Hold home Lopressor-appears to be on a very high-dose  DVT Prophylaxis: Lovenox  Family Communication: Husband at bedside    Code Status:  Full code  Condition:  Stable  Time spent in minutes : 60   ELLIS,ALLISON L. ANP on 09/01/2014 at 3:47 PM  Between 7am to 7pm - Pager - [phone]  After 7pm go to [email or web address] - password [email or web address]  And look for the night coverage person covering me after hours  [facility]

## 2014-09-01 NOTE — ED Notes (Addendum)
Report given to Tai RN on 3 E at [facility].

## 2014-09-01 NOTE — ED Provider Notes (Addendum)
CSN: [id number]     Arrival date & time 09/01/14  0419 History   First MD Initiated Contact with Patient 09/01/14 [phone]     Chief Complaint  Patient presents with  . Altered Mental Status     (Consider location/radiation/quality/duration/timing/severity/associated sxs/prior Treatment) HPI  Level 5 Caveat: altered mental status. This is a 74 year old female with long-standing rheumatoid arthritis. She has had decline over the past 3 weeks during which she has had increasing weakness, decreased activity, anorexia and weight loss. Since yesterday she has become confused and somnolent. She was seen in her PCPs office yesterday where she was noted to weigh 91 pounds. She has not been complaining of pain although she does have chronic pain due to the rheumatoid arthritis. She has not been febrile. On arrival her sugar was noted to be 58 and she was given D50 IV without significant change.  Past Medical History  Diagnosis Date  . Thyroid disease   . Hypertension   . Osteoarthritis   . Colitis   . Rheumatoid arthritis(714.0)    Past Surgical History  Procedure Laterality Date  . Tonsillectomy    . Cholecystectomy    . Abdominal hysterectomy    . Back surgery    . Cesarean section     History reviewed. No pertinent family history. History  Substance Use Topics  . Smoking status: Never Smoker   . Smokeless tobacco: Not on file  . Alcohol Use: No   OB History    No data available     Review of Systems  Unable to perform ROS     Allergies  Ciprofloxacin; Leflunomide; Macrodantin; Methotrexate derivatives; Penicillins; and Tetanus toxoids  Home Medications   Prior to Admission medications   Medication Sig Start Date End Date Taking? Authorizing Provider  cephALEXin (KEFLEX) 250 MG capsule Take 250 mg by mouth daily.    Historical Provider, MD  cholecalciferol (VITAMIN D) 1000 UNITS tablet Take 2,000 Units by mouth daily.    Historical Provider, MD  diazepam (VALIUM) 5 MG  tablet Take 5 mg by mouth every 6 (six) hours as needed.    Historical Provider, MD  etanercept (ENBREL) 50 MG/ML injection Inject 50 mg into the skin once a week.    Historical Provider, MD  Folic Acid-Vit U7-MLY Y50 (FOLPLEX 2.2) 2.2-25-0.5 MG TABS Take by mouth.    Historical Provider, MD  hydrocodone-acetaminophen (LORCET-HD) 5-500 MG per capsule Take 1 capsule by mouth every 6 (six) hours as needed.    Historical Provider, MD  HYDROcodone-acetaminophen (VICODIN) 5-500 MG per tablet Take 1-2 tablets by mouth every 6 (six) hours as needed for pain. 03/01/12   Malvin Johns, MD  hydroxychloroquine (PLAQUENIL) 200 MG tablet Take by mouth daily.    Historical Provider, MD  levothyroxine (SYNTHROID, LEVOTHROID) 75 MCG tablet Take 75 mcg by mouth daily.    Historical Provider, MD  metoprolol (LOPRESSOR) 100 MG tablet Take 100 mg by mouth daily.    Historical Provider, MD  oxyCODONE-acetaminophen (PERCOCET) 7.5-325 MG per tablet Take 1 tablet by mouth every 4 (four) hours as needed.    Historical Provider, MD  pantoprazole (PROTONIX) 40 MG tablet Take 40 mg by mouth daily.    Historical Provider, MD  rifaximin (XIFAXAN) 200 MG tablet Take 200 mg by mouth 3 (three) times daily.    Historical Provider, MD  zoledronic acid (RECLAST) 5 MG/100ML SOLN Inject 5 mg into the vein once.    Historical Provider, MD   BP 102/55 mmHg  Pulse  65  Temp(Src) 98.4 F (36.9 C) (Oral)  Resp 17  Wt 91 lb (41.277 kg)  SpO2 100%   Physical Exam  General: Well-developed, cachectic female in no acute distress; appearance consistent with age of record HENT: normocephalic; atraumatic Eyes: pupils equal, round and reactive to light; extraocular muscles intact Neck: supple Heart: regular rate and rhythm Lungs: clear to auscultation bilaterally Abdomen: soft; nondistended; nontender; no masses or hepatosplenomegaly; bowel sounds present Extremities: Arthritic changes including ulnar deviation of fingers; no  edema Neurologic: Awake, alert and oriented x 2; motor function intact in all extremities and symmetric but with generalized weakness; no facial droop Skin: Warm and dry Psychiatric: Flat affect    ED Course  Procedures (including critical care time)   MDM   Nursing notes and vitals signs, including pulse oximetry, reviewed.  Summary of this visit's results, reviewed by myself:   EKG Interpretation  Date/Time:  Wednesday September 01 2014 04:40:51 EDT Ventricular Rate:  70 PR Interval:  128 QRS Duration: 72 QT Interval:  452 QTC Calculation: 488 R Axis:   42 Text Interpretation:  Normal sinus rhythm Nonspecific T wave abnormality Abnormal ECG No old tracing to compare Confirmed by [facility]  MD, Jenny Reichmann ([id number]) on 09/01/2014 6:33:03 AM       Labs:  Results for orders placed or performed during the hospital encounter of 09/01/14 (from the past 24 hour(s))  CBG monitoring, ED     Status: Abnormal   Collection Time: 09/01/14  4:29 AM  Result Value Ref Range   Glucose-Capillary 58 (L) 70 - 99 mg/dL  Comprehensive metabolic panel     Status: Abnormal   Collection Time: 09/01/14  4:50 AM  Result Value Ref Range   Sodium 139 135 - 145 mmol/L   Potassium 4.4 3.5 - 5.1 mmol/L   Chloride 109 96 - 112 mmol/L   CO2 13 (L) 19 - 32 mmol/L   Glucose, Bld 67 (L) 70 - 99 mg/dL   BUN 21 6 - 23 mg/dL   Creatinine, Ser 1.28 (H) 0.50 - 1.10 mg/dL   Calcium 9.2 8.4 - 10.5 mg/dL   Total Protein 7.5 6.0 - 8.3 g/dL   Albumin 4.3 3.5 - 5.2 g/dL   AST 25 0 - 37 U/L   ALT 20 0 - 35 U/L   Alkaline Phosphatase 38 (L) 39 - 117 U/L   Total Bilirubin 1.1 0.3 - 1.2 mg/dL   GFR calc non Af Amer 40 (L) >90 mL/min   GFR calc Af Amer 47 (L) >90 mL/min   Anion gap 17 (H) 5 - 15  CBC with Differential     Status: Abnormal   Collection Time: 09/01/14  4:50 AM  Result Value Ref Range   WBC 8.4 4.0 - 10.5 K/uL   RBC 4.10 3.87 - 5.11 MIL/uL   Hemoglobin 13.6 12.0 - 15.0 g/dL   HCT 38.5 36.0 - 46.0 %   MCV  93.9 78.0 - 100.0 fL   MCH 33.2 26.0 - 34.0 pg   MCHC 35.3 30.0 - 36.0 g/dL   RDW 12.9 11.5 - 15.5 %   Platelets 210 150 - 400 K/uL   Neutrophils Relative % 37 (L) 43 - 77 %   Neutro Abs 3.1 1.7 - 7.7 K/uL   Lymphocytes Relative 47 (H) 12 - 46 %   Lymphs Abs 3.9 0.7 - 4.0 K/uL   Monocytes Relative 15 (H) 3 - 12 %   Monocytes Absolute 1.3 (H) 0.1 - 1.0 K/uL  Eosinophils Relative 1 0 - 5 %   Eosinophils Absolute 0.1 0.0 - 0.7 K/uL   Basophils Relative 0 0 - 1 %   Basophils Absolute 0.0 0.0 - 0.1 K/uL  Urinalysis, Routine w reflex microscopic     Status: Abnormal   Collection Time: 09/01/14  5:15 AM  Result Value Ref Range   Color, Urine YELLOW YELLOW   APPearance CLEAR CLEAR   Specific Gravity, Urine 1.020 1.005 - 1.030   pH 5.5 5.0 - 8.0   Glucose, UA 500 (A) NEGATIVE mg/dL   Hgb urine dipstick NEGATIVE NEGATIVE   Bilirubin Urine MODERATE (A) NEGATIVE   Ketones, ur >80 (A) NEGATIVE mg/dL   Protein, ur 30 (A) NEGATIVE mg/dL   Urobilinogen, UA 0.2 0.0 - 1.0 mg/dL   Nitrite NEGATIVE NEGATIVE   Leukocytes, UA NEGATIVE NEGATIVE  Urine microscopic-add on     Status: Abnormal   Collection Time: 09/01/14  5:15 AM  Result Value Ref Range   Squamous Epithelial / LPF RARE RARE   WBC, UA 0-2 <3 WBC/hpf   RBC / HPF 0-2 <3 RBC/hpf   Bacteria, UA FEW (A) RARE   Casts HYALINE CASTS (A) NEGATIVE  I-Stat CG4 Lactic Acid, ED     Status: None   Collection Time: 09/01/14  7:31 AM  Result Value Ref Range   Lactic Acid, Venous 0.66 0.5 - 2.0 mmol/L  I-Stat Venous Blood Gas, ED (order at [facility] and [facility] only)     Status: Abnormal   Collection Time: 09/01/14  7:32 AM  Result Value Ref Range   pH, Ven 7.306 (H) 7.250 - 7.300   pCO2, Ven 30.2 (L) 45.0 - 50.0 mmHg   pO2, Ven 28.0 (LL) 30.0 - 45.0 mmHg   Bicarbonate 15.1 (L) 20.0 - 24.0 mEq/L   TCO2 16 0 - 100 mmol/L   O2 Saturation 48.0 %   Acid-base deficit 10.0 (H) 0.0 - 2.0 mmol/L   Patient temperature 98.4 F    Collection site IV START     Drawn by Nurse    Sample type VENOUS    Comment VALUES EXPECTED, NO REPEAT   CBG monitoring, ED     Status: Abnormal   Collection Time: 09/01/14  7:39 AM  Result Value Ref Range   Glucose-Capillary 121 (H) 70 - 99 mg/dL    Imaging Studies: Ct Head Wo Contrast  09/01/2014   CLINICAL DATA:  Acute onset of altered mental status. Confusion. Initial encounter.  EXAM: CT HEAD WITHOUT CONTRAST  TECHNIQUE: Contiguous axial images were obtained from the base of the skull through the vertex without intravenous contrast.  COMPARISON:  None.  FINDINGS: There is no evidence of acute infarction, mass lesion, or intra- or extra-axial hemorrhage on CT.  Mild periventricular white matter change likely reflects small vessel ischemic microangiopathy.  The posterior fossa, including the cerebellum, brainstem and fourth ventricle, is within normal limits. The third and lateral ventricles, and basal ganglia are unremarkable in appearance. The cerebral hemispheres are symmetric in appearance, with normal gray-white differentiation. No mass effect or midline shift is seen.  There is no evidence of fracture; visualized osseous structures are unremarkable in appearance. The visualized portions of the orbits are within normal limits. The paranasal sinuses and mastoid air cells are well-aerated. No significant soft tissue abnormalities are seen.  IMPRESSION: 1. No acute intracranial pathology seen on CT. 2. Mild small vessel ischemic microangiopathy.   Electronically Signed   By: Garald Balding M.D.   On: 09/01/2014  07:00   7:10 AM Spoke with Dr. Posey Pronto of [facility]. He accepts for transfer to [facility]. A venous blood gas and lactic acid level are pending at this time.       Shanon Rosser, MD 09/01/14 [place], MD 09/01/14 [phone]

## 2014-09-01 NOTE — ED Notes (Signed)
TV dinner given to Pt with diet coke per her request.

## 2014-09-01 NOTE — ED Notes (Signed)
Pt states she is not hungry. Did not touch TV dinner.

## 2014-09-01 NOTE — ED Provider Notes (Signed)
There are currently no stepdown beds at [facility]. I reevaluated patient as she has been waiting in this ER for a bed for the last several hours. Her blood pressure has remained in the high 90s/low 100s. She is awake and alert but confused per family. She is in no distress. Her lactic acid is normal. Her blood gas to show a mild acidosis of 7.3 with decreased CO2 and bicarbonate. I reconsult to the hospitalist and after a discussion given the patient appears well we will change her admission to telemetry as I do not feel she needs stepdown monitoring at this moment.  Sherwood Gambler, MD 09/01/14 1344

## 2014-09-01 NOTE — ED Notes (Signed)
[facility] at bedside for transport. 

## 2014-09-02 ENCOUNTER — Encounter (HOSPITAL_COMMUNITY): Payer: Self-pay | Admitting: General Practice

## 2014-09-02 DIAGNOSIS — E038 Other specified hypothyroidism: Secondary | ICD-10-CM

## 2014-09-02 DIAGNOSIS — F05 Delirium due to known physiological condition: Secondary | ICD-10-CM

## 2014-09-02 LAB — CBC
HCT: 29.5 % — ABNORMAL LOW (ref 36.0–46.0)
HEMOGLOBIN: 10.4 g/dL — AB (ref 12.0–15.0)
MCH: 31.8 pg (ref 26.0–34.0)
MCHC: 35.3 g/dL (ref 30.0–36.0)
MCV: 90.2 fL (ref 78.0–100.0)
Platelets: 145 10*3/uL — ABNORMAL LOW (ref 150–400)
RBC: 3.27 MIL/uL — AB (ref 3.87–5.11)
RDW: 12.9 % (ref 11.5–15.5)
WBC: 5 10*3/uL (ref 4.0–10.5)

## 2014-09-02 LAB — IRON AND TIBC
Iron: 121 ug/dL (ref 42–145)
Saturation Ratios: 61 % — ABNORMAL HIGH (ref 20–55)
TIBC: 200 ug/dL — ABNORMAL LOW (ref 250–470)
UIBC: 79 ug/dL — ABNORMAL LOW (ref 125–400)

## 2014-09-02 LAB — BASIC METABOLIC PANEL
Anion gap: 7 (ref 5–15)
BUN: 11 mg/dL (ref 6–23)
CHLORIDE: 118 mmol/L — AB (ref 96–112)
CO2: 20 mmol/L (ref 19–32)
Calcium: 8.5 mg/dL (ref 8.4–10.5)
Creatinine, Ser: 0.93 mg/dL (ref 0.50–1.10)
GFR calc Af Amer: 68 mL/min — ABNORMAL LOW (ref >90)
GFR calc non Af Amer: 59 mL/min — ABNORMAL LOW (ref >90)
GLUCOSE: 81 mg/dL (ref 70–99)
Potassium: 3.2 mmol/L — ABNORMAL LOW (ref 3.5–5.1)
Sodium: 145 mmol/L (ref 135–145)

## 2014-09-02 LAB — FOLATE: Folate: >20 ng/mL

## 2014-09-02 LAB — VITAMIN B12: VITAMIN B 12: 1945 pg/mL — AB (ref 211–911)

## 2014-09-02 LAB — FERRITIN: Ferritin: 348 ng/mL — ABNORMAL HIGH (ref 10–291)

## 2014-09-02 MED ORDER — THIAMINE HCL 100 MG PO TABS: 100.0000 mg | ORAL_TABLET | Freq: Every day | ORAL | Status: DC

## 2014-09-02 MED ORDER — FOLIC ACID 1 MG PO TABS
1.0000 mg | ORAL_TABLET | Freq: Every day | ORAL | Status: DC
  Administered 2014-09-02: 1 mg via ORAL
  Filled 2014-09-02: qty 1

## 2014-09-02 MED ORDER — MEGESTROL ACETATE 40 MG PO TABS
40.0000 mg | ORAL_TABLET | Freq: Every day | ORAL | Status: DC
  Administered 2014-09-02: 40 mg via ORAL
  Filled 2014-09-02: qty 1

## 2014-09-02 MED ORDER — VITAMIN B-1 100 MG PO TABS
100.0000 mg | ORAL_TABLET | Freq: Every day | ORAL | Status: DC
  Administered 2014-09-02: 100 mg via ORAL
  Filled 2014-09-02: qty 1

## 2014-09-02 MED ORDER — LEVOTHYROXINE SODIUM 50 MCG PO TABS
50.0000 ug | ORAL_TABLET | Freq: Every day | ORAL | Status: DC
  Filled 2014-09-02: qty 1

## 2014-09-02 MED ORDER — MEGESTROL ACETATE 40 MG PO TABS: 40.0000 mg | ORAL_TABLET | Freq: Every day | ORAL | Status: DC

## 2014-09-02 MED ORDER — BOOST PLUS PO LIQD: 237.0000 mL | Freq: Three times a day (TID) | ORAL | Status: DC

## 2014-09-02 MED ORDER — METOPROLOL SUCCINATE ER 200 MG PO TB24: 100.0000 mg | ORAL_TABLET | Freq: Every day | ORAL | Status: DC

## 2014-09-02 MED ORDER — LEVOTHYROXINE SODIUM 50 MCG PO TABS: 50.0000 ug | ORAL_TABLET | Freq: Every day | ORAL | Status: DC

## 2014-09-02 MED ORDER — FOLIC ACID 1 MG PO TABS: 1.0000 mg | ORAL_TABLET | Freq: Every day | ORAL | Status: AC

## 2014-09-02 MED ORDER — BOOST / RESOURCE BREEZE PO LIQD: 1.0000 | Freq: Three times a day (TID) | ORAL | Status: DC

## 2014-09-02 NOTE — Progress Notes (Signed)
D/C IV. D/C Tele. D/C paperwork reviewed with pt. And paperwork given to pt. And prescriptions given to pt.'s husband. Pt. Belongings packed by pt.'s husband. Pt. Left unit by wheelchair and transported home by husband.

## 2014-09-02 NOTE — Evaluation (Signed)
Physical Therapy Evaluation Patient Details Name: Ann Gonzales MRN: [id number] DOB: 1940/07/05 Today's Date: 09/02/2014   History of Present Illness  Pt is a 74 y.o. Female admitted 09/01/14 with AMS and generalized weakness. Husband reports progressive decline over the past year.   Clinical Impression  Pt admitted with above diagnosis. Pt currently with functional limitations due to the deficits listed below (see PT Problem List). Pt understands PT recommendation to use RW at all times on d/c.  Pt agrees. HHPT recommended as well.  Pt will benefit from skilled PT to increase their independence and safety with mobility to allow discharge to the venue listed below.      Follow Up Recommendations Home health PT;Supervision - Intermittent    Equipment Recommendations  None recommended by PT    Recommendations for Other Services       Precautions / Restrictions Precautions Precautions: Fall Restrictions Weight Bearing Restrictions: No      Mobility  Bed Mobility Overal bed mobility: Modified Independent                Transfers Overall transfer level: Needs assistance Equipment used: None Transfers: Sit to/from Stand Sit to Stand: Min guard         General transfer comment: MIn guard to stand due to unsteadiness.   Ambulation/Gait Ambulation/Gait assistance: Min assist Ambulation Distance (Feet): 200 Feet Assistive device: None Gait Pattern/deviations: Decreased stride length;Staggering left;Staggering right;Narrow base of support;Step-to pattern   Gait velocity interpretation: Below normal speed for age/gender General Gait Details: Pt ambulating with shortened step length taking "baby steps" for some time.  When asked to take larger steps, pt able to but losing balance.  LOB with and without the proper stride however balance worsened when pt taking the larger steps.  Encouraged pt to use a RW at home until she became more stable.  Pt could not withstand any  challenges to balance.    Stairs            Wheelchair Mobility    Modified Rankin (Stroke Patients Only)       Balance Overall balance assessment: Needs assistance;History of Falls Sitting-balance support: No upper extremity supported;Feet supported Sitting balance-Leahy Scale: Good     Standing balance support: No upper extremity supported;During functional activity Standing balance-Leahy Scale: Poor Standing balance comment: Requires UE support for balance.              High level balance activites: Direction changes;Turns;Sudden stops High Level Balance Comments: Needed min assist for balance.               Pertinent Vitals/Pain Pain Assessment: No/denies pain  VSS    Home Living Family/patient expects to be discharged to:: Private residence Living Arrangements: Spouse/significant other Available Help at Discharge: Family;Available PRN/intermittently Type of Home: House Home Access: Level entry     Home Layout: One level Home Equipment: Walker - 2 wheels;Shower seat      Prior Function Level of Independence: Independent         Comments: Pt reports independence with ADLs and no use of DME     Hand Dominance        Extremity/Trunk Assessment   Upper Extremity Assessment: Defer to OT evaluation           Lower Extremity Assessment: Generalized weakness      Cervical / Trunk Assessment: Normal  Communication   Communication: No difficulties  Cognition Arousal/Alertness: Awake/alert Behavior During Therapy: WFL for tasks assessed/performed Overall Cognitive Status: Within  Functional Limits for tasks assessed                      General Comments      Exercises General Exercises - Lower Extremity Ankle Circles/Pumps: AROM;Both;10 reps;Seated Long Arc Quad: AROM;Both;10 reps;Seated      Assessment/Plan    PT Assessment Patient needs continued PT services  PT Diagnosis Generalized weakness   PT Problem List  Decreased activity tolerance;Decreased balance;Decreased mobility;Decreased knowledge of use of DME;Decreased knowledge of precautions;Decreased safety awareness;Decreased strength  PT Treatment Interventions DME instruction;Gait training;Functional mobility training;Therapeutic activities;Therapeutic exercise;Balance training;Stair training;Patient/family education   PT Goals (Current goals can be found in the Care Plan section) Acute Rehab PT Goals Patient Stated Goal: to go home PT Goal Formulation: With patient Time For Goal Achievement: 09/09/14 Potential to Achieve Goals: Good    Frequency Min 3X/week   Barriers to discharge Decreased caregiver support (husband works)      Insurance risk surveyor During Treatment: Gait belt Activity Tolerance: Patient limited by fatigue Patient left: in chair;with call bell/phone within reach;with chair alarm set Nurse Communication: Mobility status         Time: 1150-1208 PT Time Calculation (min) (ACUTE ONLY): 18 min   Charges:   PT Evaluation $Initial PT Evaluation Tier I: 1 Procedure     PT G CodesDenice Paradise 09-23-14, 2:03 PM Arturo Freundlich [facility] Acute Rehabilitation [phone] (pager)

## 2014-09-02 NOTE — Progress Notes (Signed)
Initial Nutrition Assessment  DOCUMENTATION CODES:  Non-severe (moderate) malnutrition in context of chronic illness  INTERVENTION:  Ensure Enlive (each supplement provides 350kcal and 20 grams of protein), Boost Breeze   Provided and discussed "High Calorie High Protein Nutrition Therapy" and "Suggestions for Increasing Calories and Protein" handouts from the Academy of Nutrition and Dietetics.  NUTRITION DIAGNOSIS:  Inadequate oral intake related to other (see comment) (Poor appetite) as evidenced by moderate depletion of body fat, moderate depletions of muscle mass, per patient/family report.   GOAL:  Patient will meet greater than or equal to 90% of their needs   MONITOR:  PO intake, Supplement acceptance, Weight trends  REASON FOR ASSESSMENT:  Consult Assessment of nutrition requirement/status  ASSESSMENT: 74 year old female patient referred for admission to [facility], and hospital by Dr. Ardith Dark emergency room physician. This patient has a past medical history of hypothyroidism, hypertension, rheumatoid arthritis who presents with progressive weakness for the past 3 weeks.   Pt states that she used to weigh 121 lbs a few years ago but, due to a poor appetite and little desire to eat she has gradually lost 30 lbs over the past few years. She reports eating a sandwich for lunch and a small plate for dinner most days. She reports issues with constipation and diarrhea; goes 1.5 weeks without a BM, followed by a few days of diarrhea. Per physical exam, pt has moderate wasting of muscle mass and fat mass. She states that she ate 25% of breakfast this morning but, no lunch.  RD emphasized the importance of eating well and getting adequate protein daily. Encouraged small frequent meals and snacks. Discussed ways to add calories and protein to current diet and encouraged intake of nutrient-dense beverages. Pt agreeable to trying nutritional supplements. RD provided coupons.  Labs  reviewed.   Height:  Ht Readings from Last 1 Encounters:  03/01/12 4\' 11"  (1.499 m)    Weight:  Wt Readings from Last 1 Encounters:  09/02/14 91 lb 0.8 oz (41.3 kg)    Ideal Body Weight:   unknown  Wt Readings from Last 10 Encounters:  09/02/14 91 lb 0.8 oz (41.3 kg)  03/01/12 109 lb (49.442 kg)    BMI:  Body mass index is 18.38 kg/(m^2). (Underweight)  Estimated Nutritional Needs:  Kcal:  1300-1500  Protein:  60-70 grams  Fluid:  1.3-1.5 L/day  Skin:    intact  Diet Order:  DIET SOFT Room service appropriate?: Yes; Fluid consistency:: Thin Diet - low sodium heart healthy  EDUCATION NEEDS:  Education needs addressed   Intake/Output Summary (Last 24 hours) at 09/02/14 1552 Last data filed at 09/02/14 1317  Gross per 24 hour  Intake 2091.33 ml  Output    953 ml  Net 1138.33 ml    Last BM:  4/27   Pryor Ochoa RD, LDN Inpatient Clinical Dietitian Pager: [phone] After Hours Pager: [phone]

## 2014-09-02 NOTE — Care Management Note (Signed)
    Page 1 of 1   09/02/2014     4:48:30 PM CARE MANAGEMENT NOTE 09/02/2014  Patient:  Ann Gonzales, Ann Gonzales   Account Number:  [id number]  Date Initiated:  09/02/2014  Documentation initiated by:  Jibran Crookshanks  Subjective/Objective Assessment:   Pt adm on 09/01/14 with FTT, dehydration.  PTA, pt resides at home with spouse.     Action/Plan:   PT/OT pending; will follow for recommendations.   Anticipated DC Date:  09/02/2014   Anticipated DC Plan:  [place]  CM consult      [facility] Choice  HOME HEALTH   Choice offered to / List presented to:  C-3 Spouse        HH arranged  HH-1 RN  [place].   Status of service:  Completed, signed off Medicare Important Message given?  NA - LOS <3 / Initial given by admissions (If response is "NO", the following Medicare IM given date fields will be blank) Date Medicare IM given:   Medicare IM given by:   Date Additional Medicare IM given:   Additional Medicare IM given by:    Discharge Disposition:  [place]  Per UR Regulation:  Reviewed for med. necessity/level of care/duration of stay  If discussed at [place] of Stay Meetings, dates discussed:    Comments:  09/02/14 Ellan Lambert, RN, BSN [phone] Spoke with pt's husband by phone [phone]) to discuss dc plans.  Husband agreeable for [facility] care, and prefers [facility] for [facility] needs.  Referral to [facility], per choice; start of care 24-48h post dc date.  Husband also requests PCP in [place].  Appt made with [facility] at [place]; Dr. Etter Sjogren  May 10 at 10:30.  Appt info put on AVS in EPIC.  Husband appreciative of help.

## 2014-09-02 NOTE — Discharge Summary (Addendum)
Discharge Summary  Ann Gonzales BOF:[id number] DOB: January 09, 1941  PCP: Ann Crane, MD  Admit date: 09/01/2014 Discharge date: 09/02/2014  Time spent: >58mins  Recommendations for Outpatient Follow-up:  1. F/u with PMD within two weeks, pmd to continue monitor nutrition status, bp control. Repeat bmp in two weeks. pmd to repeat tsh in 4-6weeks. 2. Patient will establish care with Ann Gonzales, Ann Gonzales.  Discharge Diagnoses:  Active Hospital Problems   Diagnosis Date Noted  . Failure to thrive in adult 09/01/2014  . Dehydration 09/01/2014  . Hypothyroidism 09/01/2014  . Rheumatoid arthritis 09/01/2014  . Metabolic acidosis 77/82/4235  . FTT (failure to thrive) in adult 09/01/2014    Resolved Hospital Problems   Diagnosis Date Noted Date Resolved  . Failure to thrive (0-17) 09/01/2014 09/01/2014    Discharge Condition: stable  Diet recommendation: heart healthy  Filed Weights   09/01/14 0431 09/02/14 0609  Weight: 41.277 kg (91 lb) 41.3 kg (91 lb 0.8 oz)    History of present illness:  This is a 74 year old female patient referred for admission to [facility] by Ann Gonzales emergency room physician. This patient has a past medical history of hypothyroidism, hypertension, rheumatoid arthritis who presents with progressive weakness for the past 3 weeks. Husband at bedside and clarifies she is actually had extensive progressive decline in her ability to perform IADLs and ADLs for at least one year. Patient has chronic pain related to rheumatoid arthritis as well as chronic low back pain. About 3 weeks ago she received an infusion of her typical rheumatoid arthritis medication and quit eating. The patient reports that the food is not appealing although she does like sweets but then doesn't eat a lot of those either. She has been attempting to utilize boost milk. She denies any abdominal pain or reflux symptoms. No blood or Gonzales stools. She does have issues with intermittent loose  stools and has been evaluated by GI for this in the past and is on rifaximin. She was recently taken off re-class for her osteoporosis due to dental issues. Unable to quantify exact amount of weight patient has lost.  Evaluation in the ER revealed normal lactate bicarbonate 13 creatinine 1.3 with baseline laboratory data unknown. Chest x-ray was unremarkable as was EKG. In addition to the above patient denied any recent upper respiratory infection symptoms or urinary tract infection symptoms or recent treatment with antibiotics for any cause.  Addendum: husband later states that patient has 10pounds weightloss in the last month. Patient states that she was sent to ED due to acute onset of confusion.  Hospital Course:  Principal Problem:   Failure to thrive in adult Active Problems:   Dehydration   Hypothyroidism   Rheumatoid arthritis   Metabolic acidosis   FTT (failure to thrive) in adult  Failure to thrive in adult/weight loss -with no appetite, 10pounds weight loss, confusion likely secondary to dehydration, malnutrition. Ct head no acute findings, no infection. Confusion resolved at discharge. -PT/OT/nutrition evaluation -continue b12/folic acid/add thiamine -Start megace -home health arranged    Dehydration -improved with ivf,    Metabolic acidosis -resolved, Likely from dehydration and mild acute renal failure   Hypothyroidism -tsh suppressed, synthroid dose decreased to 62mcg   Rheumatoid arthritis -Receives outpatient medication weekly   Hypertension -Blood pressure low normal on admission, likely secondary to dehydration, bp  meds held in the hospital, restarted at lower dose at discharge, pmd to continue monitor bp control.    Family Communication: talked to Husband over the phone  at [phone].   Code Status: Full code Procedures:  none  Consultations:  none  Discharge Exam: BP 130/73 mmHg  Pulse 95  Temp(Src) 97.6 F (36.4 C) (Oral)  Resp  18  Wt 41.3 kg (91 lb 0.8 oz)  SpO2 99%  General: In no acute distress, appears frail and underweight, pale  Psych: Normal affect, Denies Suicidal or Homicidal ideations, Awake Alert, Oriented X 3. Speech and thought patterns are clear and appropriate, no apparent short term memory deficits  Neuro: No focal neurological deficits, CN II through XII intact, Strength 5/5 all 4 extremities, Sensation intact all 4 extremities.  ENT: Ears and Eyes appear Normal, Conjunctivae clear, PER. Moist oral mucosa without erythema or exudates.  Neck: Supple, No lymphadenopathy appreciated  Respiratory: Symmetrical chest wall movement, Good air movement bilaterally, CTAB. Room Air  Cardiac: RRR, No Murmurs, no LE edema noted, no JVD, No carotid bruits, peripheral pulses palpable at 2+  Abdomen: Positive bowel sounds, Soft, mildly tender over suprapubic region, Non distended, No masses appreciated, no obvious hepatosplenomegaly  Skin: No Cyanosis, poor Skin Turgor, No Skin Rash or Bruise. Pale  Extremities: Symmetrical without obvious trauma or injury, no effusions.   Discharge Instructions You were cared for by a hospitalist during your hospital stay. If you have any questions about your discharge medications or the care you received while you were in the hospital after you are discharged, you can call the unit and asked to speak with the hospitalist on call if the hospitalist that took care of you is not available. Once you are discharged, your primary care physician will handle any further medical issues. Please note that NO REFILLS for any discharge medications will be authorized once you are discharged, as it is imperative that you return to your primary care physician (or establish a relationship with a primary care physician if you do not have one) for your aftercare needs so that they can reassess your need for medications and monitor your lab values.      Discharge Instructions     Diet - low sodium heart healthy    Complete by:  As directed      Face-to-face encounter (required for Medicare/Medicaid patients)    Complete by:  As directed   I Ann Gonzales certify that this patient is under my care and that I, or a nurse practitioner or physician's assistant working with me, had a face-to-face encounter that meets the physician face-to-face encounter requirements with this patient on 09/02/2014. The encounter with the patient was in whole, or in part for the following medical condition(s) which is the primary reason for home health care (List medical condition): FTT  The encounter with the patient was in whole, or in part, for the following medical condition, which is the primary reason for home health care:  FTT  I certify that, based on my findings, the following services are medically necessary home health services:   Nursing Physical therapy    Reason for Medically Necessary Home Health Services:  Skilled Nursing- Change/Decline in Patient Status  My clinical findings support the need for the above services:  Cognitive impairments, dementia, or mental confusion  that make it unsafe to leave home  Further, I certify that my clinical findings support that this patient is homebound due to:  Mental confusion     Home Health    Complete by:  As directed   To provide the following care/treatments:   PT OT RN  Increase activity slowly    Complete by:  As directed             Medication List    STOP taking these medications        RECLAST 5 MG/100ML Soln injection  Generic drug:  zoledronic acid      TAKE these medications        cholecalciferol 1000 UNITS tablet  Commonly known as:  VITAMIN D  Take 2,000 Units by mouth daily.     diazepam 5 MG tablet  Commonly known as:  VALIUM  Take 5 mg by mouth every 6 (six) hours as needed.     etanercept 50 MG/ML injection  Commonly known as:  ENBREL  Inject 50 mg into the skin once a week.     feeding supplement  (RESOURCE BREEZE) Liqd  Take 1 Container by mouth 3 (three) times daily between meals.     lactose free nutrition Liqd  Take 237 mLs by mouth 3 (three) times daily with meals.     folic acid 1 MG tablet  Commonly known as:  FOLVITE  Take 1 tablet (1 mg total) by mouth daily.     HYDROcodone-acetaminophen 5-500 MG per tablet  Commonly known as:  VICODIN  Take 1-2 tablets by mouth every 6 (six) hours as needed for pain.     hydroxychloroquine 200 MG tablet  Commonly known as:  PLAQUENIL  Take by mouth daily.     levothyroxine 50 MCG tablet  Commonly known as:  SYNTHROID, LEVOTHROID  Take 1 tablet (50 mcg total) by mouth daily before breakfast.  Start taking on:  09/03/2014     megestrol 40 MG tablet  Commonly known as:  MEGACE  Take 1 tablet (40 mg total) by mouth daily.     metoprolol 200 MG 24 hr tablet  Commonly known as:  TOPROL-XL  Take 0.5 tablets (100 mg total) by mouth daily.     oxyCODONE-acetaminophen 7.5-325 MG per tablet  Commonly known as:  PERCOCET  Take 1 tablet by mouth every 4 (four) hours as needed.     pantoprazole 40 MG tablet  Commonly known as:  PROTONIX  Take 40 mg by mouth daily.     thiamine 100 MG tablet  Take 1 tablet (100 mg total) by mouth daily.     VITAMIN B 12 PO  Take 1 tablet by mouth daily.       Allergies  Allergen Reactions  . Ciprofloxacin     Doesn't remember   . Leflunomide   . Macrodantin [Nitrofurantoin Macrocrystal]     Doesn't remember   . Methotrexate Derivatives Other (See Comments)    Doesn't remember  . Penicillins   . Tetanus Toxoids Rash and Other (See Comments)   Follow-up Information    Follow up with Garnet Koyanagi, DO On 09/14/2014.   Specialty:  Family Medicine   Why:  10:30  Office will mail instruction packet to you; please fill out and bring with you to your appt.  Please bring insurance cards and any medications you are taking.     Contact information:   [place]  [phone]        The results of significant diagnostics from this hospitalization (including imaging, microbiology, ancillary and laboratory) are listed below for reference.    Significant Diagnostic Studies: Ct Head Wo Contrast  09/01/2014   CLINICAL DATA:  Acute onset of altered mental status. Confusion. Initial encounter.  EXAM: CT HEAD WITHOUT CONTRAST  TECHNIQUE: Contiguous axial images were obtained from the base of the skull through the vertex without intravenous contrast.  COMPARISON:  None.  FINDINGS: There is no evidence of acute infarction, mass lesion, or intra- or extra-axial hemorrhage on CT.  Mild periventricular white matter change likely reflects small vessel ischemic microangiopathy.  The posterior fossa, including the cerebellum, brainstem and fourth ventricle, is within normal limits. The third and lateral ventricles, and basal ganglia are unremarkable in appearance. The cerebral hemispheres are symmetric in appearance, with normal gray-white differentiation. No mass effect or midline shift is seen.  There is no evidence of fracture; visualized osseous structures are unremarkable in appearance. The visualized portions of the orbits are within normal limits. The paranasal sinuses and mastoid air cells are well-aerated. No significant soft tissue abnormalities are seen.  IMPRESSION: 1. No acute intracranial pathology seen on CT. 2. Mild small vessel ischemic microangiopathy.   Electronically Signed   By: Garald Balding M.D.   On: 09/01/2014 07:00   Dg Abd Portable 1v  09/01/2014   CLINICAL DATA:  Pt complains of constipation for 3-4 days, but states she had a BM approx. 20 minutes prior to xray; h/o cholecystectomy and hysterectomy  EXAM: PORTABLE ABDOMEN - 1 VIEW  COMPARISON:  None.  FINDINGS: Normal bowel gas pattern.  No increased stool.  Status post cholecystectomy.  Soft tissues otherwise unremarkable.  Mild dextroscoliosis of the lumbar spine. There are degenerative  changes of the visualized spine and changes from kyphoplasty of the T11 fracture.  IMPRESSION: No acute findings. No increased colonic stool. No evidence of bowel obstruction.   Electronically Signed   By: Lajean Manes M.D.   On: 09/01/2014 16:44    Microbiology: No results found for this or any previous visit (from the past 240 hour(s)).   Labs: Basic Metabolic Panel:  Recent Labs Lab 09/01/14 0450 09/02/14 0333  NA 139 145  K 4.4 3.2*  CL 109 118*  CO2 13* 20  GLUCOSE 67* 81  BUN 21 11  CREATININE 1.28* 0.93  CALCIUM 9.2 8.5   Liver Function Tests:  Recent Labs Lab 09/01/14 0450  AST 25  ALT 20  ALKPHOS 38*  BILITOT 1.1  PROT 7.5  ALBUMIN 4.3   No results for input(s): LIPASE, AMYLASE in the last 168 hours. No results for input(s): AMMONIA in the last 168 hours. CBC:  Recent Labs Lab 09/01/14 0450 09/02/14 0333  WBC 8.4 5.0  NEUTROABS 3.1  --   HGB 13.6 10.4*  HCT 38.5 29.5*  MCV 93.9 90.2  PLT 210 145*   Cardiac Enzymes: No results for input(s): CKTOTAL, CKMB, CKMBINDEX, TROPONINI in the last 168 hours. BNP: BNP (last 3 results) No results for input(s): BNP in the last 8760 hours.  ProBNP (last 3 results) No results for input(s): PROBNP in the last 8760 hours.  CBG:  Recent Labs Lab 09/01/14 0429 09/01/14 0739  GLUCAP 58* 121*       Signed:  Dshaun Reppucci MD, PhD  [facility] 09/02/2014, 3:51 PM

## 2014-09-02 NOTE — Evaluation (Signed)
Occupational Therapy Evaluation Patient Details Name: Ann Gonzales MRN: [id number] DOB: 06-01-1940 Today's Date: 09/02/2014    History of Present Illness Pt is a 74 y.o. Female admitted 09/01/14 with AMS and generalized weakness. Husband reports progressive decline over the past year.    Clinical Impression   PTA pt lived at home with her husband was independent with ADLs. Pt currently limited by generalized weakness and requires min A for functional mobility. Pt will benefit from use of RW at d/c and recommend HHOT for strengthening and safety with ADLs. Pt will benefit from acute OT to progress to Mod I level to return home with PRN assist from husband.     Follow Up Recommendations  Home health OT    Equipment Recommendations  None recommended by OT    Recommendations for Other Services       Precautions / Restrictions Precautions Precautions: Fall Restrictions Weight Bearing Restrictions: No      Mobility Bed Mobility Overal bed mobility: Modified Independent                Transfers Overall transfer level: Needs assistance Equipment used: None Transfers: Sit to/from Stand Sit to Stand: Min guard         General transfer comment: MIn guard to stand due to unsteadiness.          ADL Overall ADL's : Needs assistance/impaired Eating/Feeding: Independent;Sitting   Grooming: Minimal assistance;Standing Grooming Details (indicate cue type and reason): min A for balance Upper Body Bathing: Set up;Sitting   Lower Body Bathing: Sit to/from stand;Min guard   Upper Body Dressing : Set up;Sitting   Lower Body Dressing: Min guard;Sit to/from stand   Toilet Transfer: Minimal assistance;Ambulation Toilet Transfer Details (indicate cue type and reason): min A for balance; min guard for sit<>stand Toileting- Clothing Manipulation and Hygiene: Min guard;Sit to/from stand       Functional mobility during ADLs: Minimal assistance       Vision Additional  Comments: No change from baseline          Pertinent Vitals/Pain Pain Assessment: No/denies pain        Extremity/Trunk Assessment Upper Extremity Assessment Upper Extremity Assessment: Generalized weakness   Lower Extremity Assessment Lower Extremity Assessment: Defer to PT evaluation   Cervical / Trunk Assessment Cervical / Trunk Assessment: Normal   Communication Communication Communication: No difficulties   Cognition Arousal/Alertness: Awake/alert Behavior During Therapy: WFL for tasks assessed/performed Overall Cognitive Status: Within Functional Limits for tasks assessed                                Home Living Family/patient expects to be discharged to:: Private residence Living Arrangements: Spouse/significant other Available Help at Discharge: Family;Available PRN/intermittently Type of Home: House Home Access: Level entry     Home Layout: One level     Bathroom Shower/Tub: Tub/shower unit Shower/tub characteristics: Curtain Biochemist, clinical: Standard     Home Equipment: Environmental consultant - 2 wheels;Shower seat          Prior Functioning/Environment Level of Independence: Independent        Comments: Pt reports independence with ADLs and no use of DME    OT Diagnosis: Generalized weakness   OT Problem List: Decreased strength;Impaired balance (sitting and/or standing);Decreased activity tolerance   OT Treatment/Interventions: Therapeutic exercise;Self-care/ADL training;Energy conservation;DME and/or AE instruction;Therapeutic activities;Patient/family education;Balance training    OT Goals(Current goals can be found in the care plan section) Acute  Rehab OT Goals Patient Stated Goal: to go home OT Goal Formulation: With patient Time For Goal Achievement: 09/16/14 Potential to Achieve Goals: Good ADL Goals Pt Will Perform Grooming: with modified independence;standing Pt Will Perform Lower Body Bathing: with modified independence;sit  to/from stand Pt Will Perform Lower Body Dressing: with modified independence;sit to/from stand Pt Will Transfer to Toilet: with modified independence;ambulating Pt Will Perform Toileting - Clothing Manipulation and hygiene: with modified independence;sit to/from stand  OT Frequency: Min 2X/week    End of Session Equipment Utilized During Treatment: Gait belt  Activity Tolerance: Patient tolerated treatment well Patient left: Other (comment) (with PT to ambulate)   Time: 5027-7412 OT Time Calculation (min): 11 min Charges:  OT General Charges $OT Visit: 1 Procedure OT Evaluation $Initial OT Evaluation Tier I: 1 Procedure G-Codes:    Juluis Rainier 19-Sep-2014, 12:14 PM  Cyndie Chime, OTR/L Occupational Therapist [phone] (pager)

## 2014-09-02 NOTE — Progress Notes (Signed)
Paged Baltazar Najjar, NP about patient potassium being 3.2 will continue to monitor.

## 2014-09-13 ENCOUNTER — Telehealth: Payer: Self-pay | Admitting: *Deleted

## 2014-09-13 ENCOUNTER — Encounter: Payer: Self-pay | Admitting: *Deleted

## 2014-09-13 NOTE — Telephone Encounter (Signed)
Pre-Visit Call completed with patient and chart updated.   Pre-Visit Info documented in Specialty Comments under [employer].    

## 2014-09-14 ENCOUNTER — Encounter: Payer: Self-pay | Admitting: Family Medicine

## 2014-09-14 ENCOUNTER — Ambulatory Visit (INDEPENDENT_AMBULATORY_CARE_PROVIDER_SITE_OTHER): Payer: Medicare Other | Admitting: Family Medicine

## 2014-09-14 VITALS — BP 116/70 | HR 64 | Temp 98.7°F | Ht 59.0 in | Wt 92.6 lb

## 2014-09-14 DIAGNOSIS — M069 Rheumatoid arthritis, unspecified: Secondary | ICD-10-CM

## 2014-09-14 DIAGNOSIS — F329 Major depressive disorder, single episode, unspecified: Secondary | ICD-10-CM

## 2014-09-14 DIAGNOSIS — R627 Adult failure to thrive: Secondary | ICD-10-CM

## 2014-09-14 DIAGNOSIS — F32A Depression, unspecified: Secondary | ICD-10-CM

## 2014-09-14 DIAGNOSIS — N390 Urinary tract infection, site not specified: Secondary | ICD-10-CM

## 2014-09-14 DIAGNOSIS — K219 Gastro-esophageal reflux disease without esophagitis: Secondary | ICD-10-CM | POA: Insufficient documentation

## 2014-09-14 DIAGNOSIS — R0683 Snoring: Secondary | ICD-10-CM

## 2014-09-14 DIAGNOSIS — G450 Vertebro-basilar artery syndrome: Secondary | ICD-10-CM | POA: Insufficient documentation

## 2014-09-14 DIAGNOSIS — E876 Hypokalemia: Secondary | ICD-10-CM | POA: Diagnosis not present

## 2014-09-14 DIAGNOSIS — Z8719 Personal history of other diseases of the digestive system: Secondary | ICD-10-CM

## 2014-09-14 DIAGNOSIS — E039 Hypothyroidism, unspecified: Secondary | ICD-10-CM

## 2014-09-14 DIAGNOSIS — E785 Hyperlipidemia, unspecified: Secondary | ICD-10-CM | POA: Insufficient documentation

## 2014-09-14 DIAGNOSIS — I10 Essential (primary) hypertension: Secondary | ICD-10-CM

## 2014-09-14 LAB — BASIC METABOLIC PANEL
BUN: 15 mg/dL (ref 6–23)
CHLORIDE: 114 meq/L — AB (ref 96–112)
CO2: 19 meq/L (ref 19–32)
CREATININE: 0.9 mg/dL (ref 0.40–1.20)
Calcium: 9.9 mg/dL (ref 8.4–10.5)
GFR: 65.02 mL/min (ref >60.00)
GLUCOSE: 86 mg/dL (ref 70–99)
Potassium: 3.9 mEq/L (ref 3.5–5.1)
Sodium: 143 mEq/L (ref 135–145)

## 2014-09-14 MED ORDER — MIRTAZAPINE 15 MG PO TABS: 15.0000 mg | ORAL_TABLET | Freq: Every day | ORAL | Status: DC

## 2014-09-14 MED ORDER — CEPHALEXIN 250 MG PO CAPS: 250.0000 mg | ORAL_CAPSULE | Freq: Every day | ORAL | Status: DC

## 2014-09-14 MED ORDER — METOPROLOL SUCCINATE ER 200 MG PO TB24: 100.0000 mg | ORAL_TABLET | Freq: Every day | ORAL | Status: DC

## 2014-09-14 MED ORDER — BOOST BREEZE PO LIQD: ORAL | Status: DC

## 2014-09-14 NOTE — Progress Notes (Signed)
Pre visit review using our clinic review tool, if applicable. No additional management support is needed unless otherwise documented below in the visit note. 

## 2014-09-14 NOTE — Patient Instructions (Addendum)
Drink ensure/ breeze in between meals -- 3 a day and make sure you are drinking 40 oz fluid a day      Failure to Thrive, Adult Adult failure to thrive is a condition that some older people develop. People with this condition are able to do fewer and fewer activities over time. They may lose interest in being with friends or may not want to eat or drink. This is not a normal part of aging. Many things can cause this. Health problems, long-term disease, depression, bad eating habits, cognitive impairment, or disability may play a role in the development of this condition. Most of the time, it is important to treat whatever is causing failure to thrive. Sometimes, though, it might not be possible to treat the condition. The person could be nearing the end of life. Then, treatment could make the person suffer longer. CAUSES Sometimes, no specific cause can be found. Factors that have been linked to failure to thrive include:  Diseases and medical conditions, such as:  Cancer.  Diabetes.  Stomach and intestinal (gastrointestinal) problems.  Lung disease.  Liver disease.  Kidney disease.  Heart problems.  Thyroid disease.  Neurologic problems.  Vitamin deficiencies.  Disability. This may be the result of:  A broken hip.  A stroke.  Very bad arthritis.  Infections that last a long time.  A long recovery from a surgery.  Mental health issues.  Medicines.  Eating problems.  Medicine for certain conditions. These conditions include:  Parkinson's disease.  Seizure disorder.  Anxiety.  Pain.  High blood pressure.  Depression.  Infections. SYMPTOMS  Losing weight (more than 5% of total body weight).  Getting more tired than usual after an activity.  Having trouble getting up after sitting.  Not being hungry or thirsty.  Not getting out of bed.  Not wanting to do usual activities.  Being depressed.  Getting infections often.  Having  bedsores.  Taking a long time to recover after an injury or a surgery.  Weakness. DIAGNOSIS A physical exam can help a caregiver decide if someone has adult failure to thrive. This may include questions about the person's health presently and in the past. It also may include questions about behavior and mood, such as:  Has activity changed?  Does the person seem sad?  Are eating habits different? The caregiver may ask for a list of all medicines taken because certain medicines can lead to this condition. The list should include prescription and over-the-counter medicines. The caregiver will likely order some tests. These may include:  Blood tests to check for infection, certain diseases, deficiencies, hormone levels, malnutrition, or dehydration.  Urine tests to check for urinary tract infection or kidney failure.  Imaging tests. Examples are an X-ray, a computed tomography (CT) scan, and magnetic resonance imaging (MRI).  Hearing tests.  Vision tests.  Cognitivetests to check thinking ability.  Activity tests to see if the person can do basic tasks like bathing and dressing. There also are tests to check if someone can shop, cook, or move around safely. The caregiver will check if the person is eating enough healthy food. This may include:  Checking weight.  Having blood tested for cholesterol and protein levels.  Seeing if anything else might be making it hard to eat (tooth problems, poorly fitted dentures, trouble swallowing). The person may need to see a specialist to help with diagnosis or treatment. These specialists may include a speech therapist, physical therapist, occupational therapist, dietitian, or social worker.  TREATMENT Treatment for adult failure to thrive depends on the cause. Caregivers also must decide if a treatment has a good chance of working. It often takes a team of caregivers to find the right treatment. Options may include:  Treatments to cure a  disease that can cause adult failure to thrive.  Talk therapy or medicine to treat depression.  A better diet. Eating more often, adding nutritional supplements between meals, or taking vitamins may be suggested. Sometimes, medicine is prescribed to boost appetite.  Medicine changes or stopping a medicine.  Physical therapy.  Moving to a place that offers more aid. HOME CARE INSTRUCTIONS What needs to be done at home varies from person to person. This will depend on what caused the condition and how it is treated. However, basic guidelines include:  Taking any medicine prescribed by the caregiver. Following the directions carefully is important.  Eating healthy foods. There should be enough calories in each meal. Ask the caregiver if vitamins or nutritional supplements should be taken between meals. Consider talking with a dietitian.  Exercising. Strength training is important. A physical therapist can help set up an exercise program that fits the person.  Making sure the person is safe at home.  Talking with caregivers about what should be done if the person can no longer make decisions for himself or herself. SEEK MEDICAL CARE IF:  There are any questions about medicines.  There are questions about the effects of treatment.  The person is not able to eat well.  The person is not able to move around.  The person feels very sad or hopeless. SEEK IMMEDIATE MEDICAL CARE IF:   The person has thoughts of ending his or her life.  The person cannot eat or drink.  The person does not get out of bed.  Staying at home is no longer safe.  The person has a fever. Document Released: 07/16/2011 Document Reviewed: 07/16/2011 [facility] Patient Information 2015 [place]. This information is not intended to replace advice given to you by your health care provider. Make sure you discuss any questions you have with your health care provider.

## 2014-09-15 DIAGNOSIS — R0683 Snoring: Secondary | ICD-10-CM | POA: Insufficient documentation

## 2014-09-15 NOTE — Progress Notes (Signed)
Patient ID: Ann Gonzales, female    DOB: Jun 02, 1940  Age: 74 y.o. MRN: [id number]    Subjective:  Subjective HPI Ann Gonzales presents to establish care.  Home health is requesting depression screen,  MMSE, sleep study.  Pt snoring loudly (for years).  She has become very forgetful--short term memory.  She has lost a lot of weight because she just "doesn't feel hungry".  Her son is with her.    Review of Systems  Constitutional: Negative for diaphoresis, appetite change, fatigue and unexpected weight change.  Eyes: Negative for pain, redness and visual disturbance.  Respiratory: Negative for cough, chest tightness, shortness of breath and wheezing.   Cardiovascular: Negative for chest pain, palpitations and leg swelling.  Endocrine: Negative for cold intolerance, heat intolerance, polydipsia, polyphagia and polyuria.  Genitourinary: Negative for dysuria, frequency and difficulty urinating.  Neurological: Negative for dizziness, light-headedness, numbness and headaches.  Psychiatric/Behavioral: Positive for confusion, dysphoric mood and decreased concentration. Negative for suicidal ideas, hallucinations, behavioral problems, self-injury and agitation. The patient is not nervous/anxious and is not hyperactive.     History Past Medical History  Diagnosis Date  . Thyroid disease   . Hypertension   . Osteoarthritis   . Colitis   . Rheumatoid arthritis(714.0)   . Hypothyroidism   . GERD (gastroesophageal reflux disease)   . Anxiety   . History of stomach ulcers   . Migraine     "sometimes 2-3 times/month" (09/02/2014)  . Stroke     "I think I had a mild stroke years ago" (09/02/2014)  . Fibromyalgia   . Chronic lower back pain   . Kidney stones   . Squamous cell carcinoma of nose   . Vocal cord paralysis   . Helicobacter pylori (H. pylori) infection     She has past surgical history that includes Tonsillectomy; Abdominal hysterectomy; Cesarean section (1960's X 1); Laparoscopic  cholecystectomy; Excisional hemorrhoidectomy; Back surgery (X 2); Cataract extraction w/ intraocular lens  implant, bilateral (Bilateral); Lithotripsy; Squamous cell carcinoma excision; Kyphoplasty; and Cholecystectomy.   Her family history includes Coronary artery disease in her mother; Lung cancer in her father.She reports that she has never smoked. She has never used smokeless tobacco. She reports that she does not drink alcohol or use illicit drugs.  Current Outpatient Prescriptions on File Prior to Visit  Medication Sig Dispense Refill  . cholecalciferol (VITAMIN D) 1000 UNITS tablet Take 2,000 Units by mouth daily.    . Cyanocobalamin (VITAMIN B 12 PO) Take 1 tablet by mouth daily.    . diazepam (VALIUM) 5 MG tablet Take 5 mg by mouth every 6 (six) hours as needed.    . etanercept (ENBREL) 50 MG/ML injection Inject 50 mg into the skin once a week.    . feeding supplement, RESOURCE BREEZE, (RESOURCE BREEZE) LIQD Take 1 Container by mouth 3 (three) times daily between meals. 30 Container 3  . folic acid (FOLVITE) 1 MG tablet Take 1 tablet (1 mg total) by mouth daily. 30 tablet 3  . levothyroxine (SYNTHROID, LEVOTHROID) 50 MCG tablet Take 1 tablet (50 mcg total) by mouth daily before breakfast. 30 tablet 0  . megestrol (MEGACE) 40 MG tablet Take 1 tablet (40 mg total) by mouth daily. 30 tablet 3  . oxyCODONE-acetaminophen (PERCOCET) 7.5-325 MG per tablet Take 1 tablet by mouth every 4 (four) hours as needed.    . pantoprazole (PROTONIX) 40 MG tablet Take 40 mg by mouth daily.    . hydroxychloroquine (PLAQUENIL) 200 MG tablet Take  by mouth daily.     No current facility-administered medications on file prior to visit.     Objective:  Objective Physical Exam  Constitutional: She appears well-developed and well-nourished.  HENT:  Head: Normocephalic and atraumatic.  Eyes: Conjunctivae and EOM are normal.  Neck: Normal range of motion. Neck supple. No JVD present. Carotid bruit is not  present. No thyromegaly present.  Cardiovascular: Normal rate, regular rhythm and normal heart sounds.   No murmur heard. Pulmonary/Chest: Effort normal and breath sounds normal. No respiratory distress. She has no wheezes. She has no rales. She exhibits no tenderness.  Musculoskeletal: She exhibits no edema.  Neurological: She is alert. She has normal strength.  MMSE 27/30  Psychiatric: Her speech is normal and behavior is normal. Thought content normal. Her mood appears not anxious. Her affect is not angry. She exhibits a depressed mood. She exhibits abnormal recent memory.   BP 116/70 mmHg  Pulse 64  Temp(Src) 98.7 F (37.1 C) (Oral)  Ht 4\' 11"  (1.499 m)  Wt 92 lb 9.6 oz (42.003 kg)  BMI 18.69 kg/m2  SpO2 99% Wt Readings from Last 3 Encounters:  09/14/14 92 lb 9.6 oz (42.003 kg)  09/02/14 91 lb 0.8 oz (41.3 kg)  03/01/12 109 lb (49.442 kg)     Lab Results  Component Value Date   WBC 5.0 09/02/2014   HGB 10.4* 09/02/2014   HCT 29.5* 09/02/2014   PLT 145* 09/02/2014   GLUCOSE 86 09/14/2014   ALT 20 09/01/2014   AST 25 09/01/2014   NA 143 09/14/2014   K 3.9 09/14/2014   CL 114* 09/14/2014   CREATININE 0.90 09/14/2014   BUN 15 09/14/2014   CO2 19 09/14/2014   TSH 0.026* 09/01/2014    Ct Head Wo Contrast  09/01/2014   CLINICAL DATA:  Acute onset of altered mental status. Confusion. Initial encounter.  EXAM: CT HEAD WITHOUT CONTRAST  TECHNIQUE: Contiguous axial images were obtained from the base of the skull through the vertex without intravenous contrast.  COMPARISON:  None.  FINDINGS: There is no evidence of acute infarction, mass lesion, or intra- or extra-axial hemorrhage on CT.  Mild periventricular white matter change likely reflects small vessel ischemic microangiopathy.  The posterior fossa, including the cerebellum, brainstem and fourth ventricle, is within normal limits. The third and lateral ventricles, and basal ganglia are unremarkable in appearance. The cerebral  hemispheres are symmetric in appearance, with normal gray-white differentiation. No mass effect or midline shift is seen.  There is no evidence of fracture; visualized osseous structures are unremarkable in appearance. The visualized portions of the orbits are within normal limits. The paranasal sinuses and mastoid air cells are well-aerated. No significant soft tissue abnormalities are seen.  IMPRESSION: 1. No acute intracranial pathology seen on CT. 2. Mild small vessel ischemic microangiopathy.   Electronically Signed   By: Garald Balding M.D.   On: 09/01/2014 07:00   Dg Abd Portable 1v  09/01/2014   CLINICAL DATA:  Pt complains of constipation for 3-4 days, but states she had a BM approx. 20 minutes prior to xray; h/o cholecystectomy and hysterectomy  EXAM: PORTABLE ABDOMEN - 1 VIEW  COMPARISON:  None.  FINDINGS: Normal bowel gas pattern.  No increased stool.  Status post cholecystectomy.  Soft tissues otherwise unremarkable.  Mild dextroscoliosis of the lumbar spine. There are degenerative changes of the visualized spine and changes from kyphoplasty of the T11 fracture.  IMPRESSION: No acute findings. No increased colonic stool. No evidence of  bowel obstruction.   Electronically Signed   By: Lajean Manes M.D.   On: 09/01/2014 16:44     Assessment & Plan:  Plan I have discontinued Ms. Clanton's HYDROcodone-acetaminophen, lactose free nutrition, and thiamine. I have also changed her cephALEXin. Additionally, I am having her start on mirtazapine and BOOST BREEZE. Lastly, I am having her maintain her diazepam, etanercept, oxyCODONE-acetaminophen, hydroxychloroquine, pantoprazole, cholecalciferol, Cyanocobalamin (VITAMIN B 12 PO), feeding supplement (RESOURCE BREEZE), folic acid, levothyroxine, megestrol, zoledronic acid, and metoprolol.  Meds ordered this encounter  Medications  . zoledronic acid (RECLAST) 5 MG/100ML SOLN injection    Sig: Inject 5 mg into the vein once. Done on 08/16/14  . mirtazapine  (REMERON) 15 MG tablet    Sig: Take 1 tablet (15 mg total) by mouth at bedtime.    Dispense:  30 tablet    Refill:  5  . Nutritional Supplements (BOOST BREEZE) LIQD    Sig: 530 cal / bottle--drink 3 a day between meals and between dinner and bedtime    Dispense:  90 Can    Refill:  3  . cephALEXin (KEFLEX) 250 MG capsule    Sig: Take 1 capsule (250 mg total) by mouth daily.    Dispense:  30 capsule    Refill:  11  . DISCONTD: metoprolol (TOPROL-XL) 200 MG 24 hr tablet    Sig: Take 0.5 tablets (100 mg total) by mouth daily.    Dispense:  30 tablet    Refill:  0  . metoprolol (TOPROL-XL) 200 MG 24 hr tablet    Sig: Take 0.5 tablets (100 mg total) by mouth daily.    Dispense:  30 tablet    Refill:  0    Problem List Items Addressed This Visit    Failure to thrive in adult   Relevant Medications   mirtazapine (REMERON) 15 MG tablet   Nutritional Supplements (BOOST BREEZE) LIQD   Other Relevant Orders   Ambulatory referral to Gastroenterology   Thyroid Panel With TSH    Other Visit Diagnoses    Snoring    -  Primary    Relevant Orders    Ambulatory referral to Pulmonology    Thyroid Panel With TSH    Depression        Relevant Medications    mirtazapine (REMERON) 15 MG tablet    Other Relevant Orders    Thyroid Panel With TSH    Hypokalemia        Relevant Orders    Basic metabolic panel (Completed)    Thyroid Panel With TSH    History of esophageal stricture        Relevant Orders    Ambulatory referral to Gastroenterology    Thyroid Panel With TSH    Hypothyroidism, unspecified hypothyroidism type        Relevant Medications    metoprolol (TOPROL-XL) 200 MG 24 hr tablet    Other Relevant Orders    Thyroid Panel With TSH    Essential hypertension        Relevant Medications    metoprolol (TOPROL-XL) 200 MG 24 hr tablet    Other Relevant Orders    Thyroid Panel With TSH    Urinary tract infection without hematuria, site unspecified        Relevant Medications      cephALEXin (KEFLEX) 250 MG capsule    Other Relevant Orders    Thyroid Panel With TSH       Follow-up: Return in about 4 weeks (  around 10/12/2014), or if symptoms worsen or fail to improve, for for labs.  Garnet Koyanagi, DO

## 2014-09-15 NOTE — Assessment & Plan Note (Signed)
Refer to pulmonary for sleep study.  

## 2014-09-15 NOTE — Assessment & Plan Note (Signed)
Pt encouraged to eat even though she does not feel like it con't breeze but use 500 cal tid  rto 2-3 weeks

## 2014-09-15 NOTE — Assessment & Plan Note (Signed)
Check labs con't meds 

## 2014-09-15 NOTE — Assessment & Plan Note (Signed)
F/u with rhem-- dr Abner Greenspan

## 2014-09-17 ENCOUNTER — Encounter (HOSPITAL_BASED_OUTPATIENT_CLINIC_OR_DEPARTMENT_OTHER): Payer: Self-pay

## 2014-09-17 ENCOUNTER — Ambulatory Visit (INDEPENDENT_AMBULATORY_CARE_PROVIDER_SITE_OTHER): Payer: Medicare Other | Admitting: Gastroenterology

## 2014-09-17 ENCOUNTER — Ambulatory Visit (HOSPITAL_BASED_OUTPATIENT_CLINIC_OR_DEPARTMENT_OTHER)
Admission: RE | Admit: 2014-09-17 | Discharge: 2014-09-17 | Disposition: A | Discharge status: Home / Self Care | Payer: Medicare Other | Source: Ambulatory Visit | Attending: Gastroenterology | Admitting: Gastroenterology

## 2014-09-17 ENCOUNTER — Encounter: Payer: Self-pay | Admitting: Gastroenterology

## 2014-09-17 VITALS — BP 110/70 | HR 72 | Ht 58.5 in | Wt 94.5 lb

## 2014-09-17 DIAGNOSIS — R6881 Early satiety: Secondary | ICD-10-CM | POA: Diagnosis not present

## 2014-09-17 DIAGNOSIS — R634 Abnormal weight loss: Secondary | ICD-10-CM | POA: Insufficient documentation

## 2014-09-17 MED ORDER — IOHEXOL 300 MG/ML  SOLN
100.0000 mL | Freq: Once | INTRAMUSCULAR | Status: AC | PRN
  Administered 2014-09-17: 100 mL via INTRAVENOUS

## 2014-09-17 NOTE — Progress Notes (Signed)
She was recently admitted to hosp with FTT; cbc was normal, UA showed ketones, CMET was normal except for slightly elevated Cr which has since improved to normal.  She   HPI: This is a  very pleasant 74 year old woman who was referred to me by Rosalita Chessman, DO  to evaluate  early satiety, weight loss  Chief complaint is early satiety, weight loss, swallowing difficulty  Was sent by Dr. Etter Sjogren.  Has probablem with swallowing.  For at least two years.    Son here says she won't eat or drink, has lost a lot of weight.  Probably going on 2 years.  She has early satiety, went to [facility] provider and was told to eat power bars and drink gatorade.  Was in hosp for 2-3 days.  No nausea or vomiting.  She believes she had stomach ulcer in past, never needed surgery.  She takes tylenol, 2 per day.  Does not drink etoh.  Son says memory has been drifting as well.  Has not had colon cancer screening in a long time, possibly a colonoscopy many years ago.  She believes she had polyps on last colonoscopy was told she had a real tight area on an examination.    Review of systems: Pertinent positive and negative review of systems were noted in the above HPI section. Complete review of systems was performed and was otherwise normal.   Past Medical History  Diagnosis Date  . Hypothyroidism   . Hypertension   . Osteoarthritis   . Colitis   . Rheumatoid arthritis(714.0)   . GERD (gastroesophageal reflux disease)   . Anxiety   . History of stomach ulcers   . Migraine     "sometimes 2-3 times/month" (09/02/2014)  . Stroke     "I think I had a mild stroke years ago" (09/02/2014)  . Fibromyalgia   . Chronic lower back pain   . Kidney stones   . Squamous cell carcinoma of nose   . Vocal cord paralysis   . Helicobacter pylori (H. pylori) infection     Past Surgical History  Procedure Laterality Date  . Tonsillectomy    . Abdominal hysterectomy    . Cesarean section  1960's X 1  .  Laparoscopic cholecystectomy    . Excisional hemorrhoidectomy    . Lumbar disc surgery  X 2    "lower back OR; don't know what they did"  . Cataract extraction w/ intraocular lens  implant, bilateral Bilateral   . Lithotripsy    . Squamous cell carcinoma excision      "nose"  . Kyphoplasty      Dr.Brown    Current Outpatient Prescriptions  Medication Sig Dispense Refill  . cephALEXin (KEFLEX) 250 MG capsule Take 1 capsule (250 mg total) by mouth daily. 30 capsule 11  . cholecalciferol (VITAMIN D) 1000 UNITS tablet Take 2,000 Units by mouth daily.    . Cyanocobalamin (VITAMIN B 12 PO) Take 1 tablet by mouth daily.    . diazepam (VALIUM) 5 MG tablet Take 5 mg by mouth every 6 (six) hours as needed.    . etanercept (ENBREL) 50 MG/ML injection Inject 50 mg into the skin once a week.    . feeding supplement, RESOURCE BREEZE, (RESOURCE BREEZE) LIQD Take 1 Container by mouth 3 (three) times daily between meals. 30 Container 3  . folic acid (FOLVITE) 1 MG tablet Take 1 tablet (1 mg total) by mouth daily. 30 tablet 3  . hydroxychloroquine (PLAQUENIL) 200  MG tablet Take by mouth daily.    Marland Kitchen levothyroxine (SYNTHROID, LEVOTHROID) 50 MCG tablet Take 1 tablet (50 mcg total) by mouth daily before breakfast. 30 tablet 0  . megestrol (MEGACE) 40 MG tablet Take 1 tablet (40 mg total) by mouth daily. 30 tablet 3  . metoprolol (TOPROL-XL) 200 MG 24 hr tablet Take 0.5 tablets (100 mg total) by mouth daily. 30 tablet 0  . mirtazapine (REMERON) 15 MG tablet Take 1 tablet (15 mg total) by mouth at bedtime. 30 tablet 5  . Nutritional Supplements (BOOST BREEZE) LIQD 530 cal / bottle--drink 3 a day between meals and between dinner and bedtime 90 Can 3  . oxyCODONE-acetaminophen (PERCOCET) 7.5-325 MG per tablet Take 1 tablet by mouth every 4 (four) hours as needed.    . pantoprazole (PROTONIX) 40 MG tablet Take 40 mg by mouth daily.    . zoledronic acid (RECLAST) 5 MG/100ML SOLN injection Inject 5 mg into the vein  once. Done on 08/16/14     No current facility-administered medications for this visit.    Allergies as of 09/17/2014 - Review Complete 09/17/2014  Allergen Reaction Noted  . Ciprofloxacin Rash 03/01/2012  . Leflunomide Diarrhea and Rash 03/01/2012  . Methotrexate Rash 09/14/2014  . Nitrofurantoin Rash 09/14/2014  . Penicillins Rash 03/01/2012  . Sulfa antibiotics Rash and Anxiety 09/14/2014  . Tetanus toxoid adsorbed Itching 09/14/2014  . Tetanus toxoids Rash, Other (See Comments), and Itching 03/01/2012    Family History  Problem Relation Age of Onset  . Coronary artery disease Mother   . Lung cancer Father   . Heart disease Maternal Grandmother   . Heart disease Paternal Grandmother     History   Social History  . Marital Status: Married    Spouse Name: N/A  . Number of Children: 1  . Years of Education: N/A   Occupational History  . retired    Social History Main Topics  . Smoking status: Never Smoker   . Smokeless tobacco: Never Used  . Alcohol Use: No  . Drug Use: No  . Sexual Activity: Yes   Other Topics Concern  . Not on file   Social History Narrative     Physical Exam: BP 110/70 mmHg  Pulse 72  Ht 4' 10.5" (1.486 m)  Wt 94 lb 8 oz (42.865 kg)  BMI 19.41 kg/m2 Constitutional: generally well-appearing Psychiatric: alert and oriented x3 Eyes: extraocular movements intact Mouth: oral pharynx moist, no lesions Neck: supple no lymphadenopathy Cardiovascular: heart regular rate and rhythm Lungs: clear to auscultation bilaterally Abdomen: soft, nontender, nondistended, no obvious ascites, no peritoneal signs, normal bowel sounds Extremities: no lower extremity edema bilaterally Skin: no lesions on visible extremities   Assessment and plan: 74 y.o. female with  early satiety, weight loss, nonprogressive solid food dysphagia  Her son explaining that she has waxing and waning memory difficulties. She certainly is alert and oriented 3 in the room  today. I gave her a single word and asked her to repeat that word about 10 minutes later and she was able to do just that. She may have some mild dementia setting in but I'm not sure that that would explain her significant other symptoms, really failure to thrive as well as some specific upper GI symptoms. I would like to first start with a CT scan abdomen and pelvis were explained to him that there is a very good chance I will recommend further testing including upper endoscopy and possibly colonoscopy. I agree with  referral to pulmonary for her sleep apnea. Would also consider dementia workup.   Owens Loffler, MD [facility] 09/17/2014, 9:44 AM  Cc: Rosalita Chessman, DO

## 2014-09-17 NOTE — Patient Instructions (Addendum)
You will be set up for a CT scan of abdomen and pelvis with IV and oral contrast (weight loss, early satiety) Pending those results you will probably need further testing such as upper and lower endoscopies.  CT is scheduled at [facility] on [address], for 09/17/2014 at 12:30. Start drinking contrast at 10:30am then other bottle at 11:30am. Nothing to eat or drink until after the procedure.

## 2014-09-21 ENCOUNTER — Other Ambulatory Visit: Payer: Self-pay | Admitting: Family Medicine

## 2014-09-23 ENCOUNTER — Other Ambulatory Visit: Payer: Self-pay

## 2014-09-23 MED ORDER — LEVOTHYROXINE SODIUM 50 MCG PO TABS: ORAL_TABLET | ORAL | Status: DC

## 2014-09-27 ENCOUNTER — Telehealth: Payer: Self-pay | Admitting: Gastroenterology

## 2014-09-27 NOTE — Telephone Encounter (Signed)
Pt aware that we have no pill to give her and that her previsit is on 09/29/14 and to be sure and keep that appt.  Pt agreed.

## 2014-09-29 ENCOUNTER — Ambulatory Visit (AMBULATORY_SURGERY_CENTER): Payer: Self-pay

## 2014-09-29 VITALS — Ht 59.0 in | Wt 93.0 lb

## 2014-09-29 DIAGNOSIS — R6881 Early satiety: Secondary | ICD-10-CM

## 2014-09-29 NOTE — Progress Notes (Signed)
No allergies to eggs or soy No diet/weight loss meds No home oxygen No past problems with anesthesia except very difficult to wake up  No email

## 2014-10-05 ENCOUNTER — Other Ambulatory Visit: Payer: Self-pay | Admitting: Family Medicine

## 2014-10-05 NOTE — Telephone Encounter (Signed)
Est care in 09/14/14, please advise if this refill is appropriate.      KP

## 2014-10-08 ENCOUNTER — Encounter: Payer: Self-pay | Admitting: Gastroenterology

## 2014-10-08 ENCOUNTER — Ambulatory Visit (AMBULATORY_SURGERY_CENTER): Payer: Medicare Other | Admitting: Gastroenterology

## 2014-10-08 VITALS — BP 114/70 | HR 69 | Temp 98.7°F | Resp 16 | Ht 59.0 in | Wt 93.0 lb

## 2014-10-08 DIAGNOSIS — K2990 Gastroduodenitis, unspecified, without bleeding: Secondary | ICD-10-CM | POA: Diagnosis not present

## 2014-10-08 DIAGNOSIS — K2970 Gastritis, unspecified, without bleeding: Secondary | ICD-10-CM

## 2014-10-08 DIAGNOSIS — R6881 Early satiety: Secondary | ICD-10-CM

## 2014-10-08 DIAGNOSIS — K2950 Unspecified chronic gastritis without bleeding: Secondary | ICD-10-CM

## 2014-10-08 MED ORDER — SODIUM CHLORIDE 0.9 % IV SOLN: 500.0000 mL | INTRAVENOUS | Status: DC

## 2014-10-08 NOTE — Patient Instructions (Signed)
YOU HAD AN ENDOSCOPIC PROCEDURE TODAY AT [facility]:   Refer to the procedure report that was given to you for any specific questions about what was found during the examination.  If the procedure report does not answer your questions, please call your gastroenterologist to clarify.  If you requested that your care partner not be given the details of your procedure findings, then the procedure report has been included in a sealed envelope for you to review at your convenience later.  YOU SHOULD EXPECT: Some feelings of bloating in the abdomen. Passage of more gas than usual.  Walking can help get rid of the air that was put into your GI tract during the procedure and reduce the bloating. If you had a lower endoscopy (such as a colonoscopy or flexible sigmoidoscopy) you may notice spotting of blood in your stool or on the toilet paper. If you underwent a bowel prep for your procedure, you may not have a normal bowel movement for a few days.  Please Note:  You might notice some irritation and congestion in your nose or some drainage.  This is from the oxygen used during your procedure.  There is no need for concern and it should clear up in a day or so.  SYMPTOMS TO REPORT IMMEDIATELY:    Following upper endoscopy (EGD)  Vomiting of blood or coffee ground material  New chest pain or pain under the shoulder blades  Painful or persistently difficult swallowing  New shortness of breath  Fever of 100F or higher  Black, tarry-looking stools  For urgent or emergent issues, a gastroenterologist can be reached at any hour by calling [phone].   DIET: Your first meal following the procedure should be a small meal and then it is ok to progress to your normal diet. Heavy or fried foods are harder to digest and may make you feel nauseous or bloated.  Likewise, meals heavy in dairy and vegetables can increase bloating.  Drink plenty of fluids but you should avoid alcoholic beverages  for 24 hours.  ACTIVITY:  You should plan to take it easy for the rest of today and you should NOT DRIVE or use heavy machinery until tomorrow (because of the sedation medicines used during the test).    FOLLOW UP: Our staff will call the number listed on your records the next business day following your procedure to check on you and address any questions or concerns that you may have regarding the information given to you following your procedure. If we do not reach you, we will leave a message.  However, if you are feeling well and you are not experiencing any problems, there is no need to return our call.  We will assume that you have returned to your regular daily activities without incident.  If any biopsies were taken you will be contacted by phone or by letter within the next 1-3 weeks.  Please call us at [phone] if you have not heard about the biopsies in 3 weeks.    SIGNATURES/CONFIDENTIALITY: You and/or your care partner have signed paperwork which will be entered into your electronic medical record.  These signatures attest to the fact that that the information above on your After Visit Summary has been reviewed and is understood.  Full responsibility of the confidentiality of this discharge information lies with you and/or your care-partner.     Handout was given to your care partner on gastritis..  You may resume your current medications  today. Await biopsy results. Please call if any questions or concerns.

## 2014-10-08 NOTE — Progress Notes (Signed)
To recovery, report to Willis, RN, VSS 

## 2014-10-08 NOTE — Progress Notes (Signed)
Called to room to assist during endoscopic procedure.  Patient ID and intended procedure confirmed with present staff. Received instructions for my participation in the procedure from the performing physician.  

## 2014-10-08 NOTE — Progress Notes (Signed)
No problems noted in the recovery room. maw 

## 2014-10-08 NOTE — Op Note (Addendum)
[place]  [address] [employer], [id number]   ENDOSCOPY PROCEDURE REPORT  PATIENT: Ann Gonzales, Ann Gonzales  MR#: [id number] BIRTHDATE: 1940/11/03 , 74  yrs. old GENDER: female ENDOSCOPIST: Milus Banister, MD REFERRED BY:  Garnet Koyanagi, DO PROCEDURE DATE:  10/08/2014 PROCEDURE:  EGD w/ biopsy ASA CLASS:     Class III INDICATIONS:  weight loss, early satiety, adult FTT: recent Ct scan abd/pelvis showed no clear explanation. MEDICATIONS: Monitored anesthesia care and Propofol 100 mg IV TOPICAL ANESTHETIC: none  DESCRIPTION OF PROCEDURE: After the risks benefits and alternatives of the procedure were thoroughly explained, informed consent was obtained.  The [id number] endoscope was introduced through the mouth and advanced to the second portion of the duodenum , Without limitations.  The instrument was slowly withdrawn as the mucosa was fully examined.  There was mild, non-specific distal gastritis.  This was biopsied and sent to pathology.  The examination was otherwise normal. Retroflexed views revealed no abnormalities.     The scope was then withdrawn from the patient and the procedure completed.  COMPLICATIONS: There were no immediate complications.  ENDOSCOPIC IMPRESSION: There was mild, non-specific distal gastritis.  This was biopsied and sent to pathology.  The examination was otherwise normal  RECOMMENDATIONS: Await final pathology.  If + for H.  pylori, will treat with appropriate antibiotics.  If negative, will likely recommend colonoscopy to check for occult cancer.  eSigned:  Milus Banister, MD 10/08/2014 10:03 AM

## 2014-10-08 NOTE — Progress Notes (Signed)
Pt said she may be going to the beach.  I advised her to call dr. Ardis Hughs office in 2 weeks if she has not heard from them about path results.  I gave the pt and her daughter Dr. Eugenia Pancoast business card with tel numbers if needed. maw

## 2014-10-11 ENCOUNTER — Telehealth: Payer: Self-pay | Admitting: *Deleted

## 2014-10-11 ENCOUNTER — Other Ambulatory Visit: Payer: Self-pay | Admitting: Family Medicine

## 2014-10-11 NOTE — Telephone Encounter (Signed)
No answer, message left for the patient. 

## 2014-10-12 ENCOUNTER — Other Ambulatory Visit (INDEPENDENT_AMBULATORY_CARE_PROVIDER_SITE_OTHER): Payer: Medicare Other

## 2014-10-12 ENCOUNTER — Ambulatory Visit (INDEPENDENT_AMBULATORY_CARE_PROVIDER_SITE_OTHER): Payer: Medicare Other | Admitting: Family Medicine

## 2014-10-12 ENCOUNTER — Encounter: Payer: Self-pay | Admitting: Family Medicine

## 2014-10-12 VITALS — BP 126/71 | HR 72 | Temp 97.5°F | Ht 59.0 in | Wt 94.6 lb

## 2014-10-12 DIAGNOSIS — R627 Adult failure to thrive: Secondary | ICD-10-CM | POA: Diagnosis not present

## 2014-10-12 DIAGNOSIS — E039 Hypothyroidism, unspecified: Secondary | ICD-10-CM | POA: Diagnosis not present

## 2014-10-12 DIAGNOSIS — R413 Other amnesia: Secondary | ICD-10-CM | POA: Diagnosis not present

## 2014-10-12 DIAGNOSIS — R0683 Snoring: Secondary | ICD-10-CM

## 2014-10-12 DIAGNOSIS — I10 Essential (primary) hypertension: Secondary | ICD-10-CM | POA: Diagnosis not present

## 2014-10-12 LAB — THYROID PANEL WITH TSH
Free Thyroxine Index: 2.9 (ref 1.4–3.8)
T3 Uptake: 32 % (ref 22–35)
T4, Total: 9.2 ug/dL (ref 4.5–12.0)
TSH: 0.026 u[IU]/mL — ABNORMAL LOW (ref 0.350–4.500)

## 2014-10-12 MED ORDER — METOPROLOL SUCCINATE ER 200 MG PO TB24: 100.0000 mg | ORAL_TABLET | Freq: Every day | ORAL | Status: DC

## 2014-10-12 MED ORDER — DONEPEZIL HCL 5 MG PO TABS: 5.0000 mg | ORAL_TABLET | Freq: Every day | ORAL | Status: DC

## 2014-10-12 NOTE — Progress Notes (Signed)
Patient ID: Ann Gonzales, female    DOB: September 02, 1940  Age: 74 y.o. MRN: [id number]    Subjective:  Subjective HPI Ann Gonzales presents with her daughter in law with c/o worsening memory loss.  She repeats herself many times and states she always forgets.  She has gained 2 lbs since last ov.  She is drinking ensure 4 small containers a day between meals.    Review of Systems  Constitutional: Negative for activity change, appetite change, fatigue and unexpected weight change.  Respiratory: Negative for cough and shortness of breath.   Cardiovascular: Negative for chest pain and palpitations.  Psychiatric/Behavioral: Negative for behavioral problems and dysphoric mood. The patient is not nervous/anxious.     History Past Medical History  Diagnosis Date  . Hypothyroidism   . Hypertension   . Osteoarthritis   . Colitis   . Rheumatoid arthritis(714.0)   . GERD (gastroesophageal reflux disease)   . Anxiety   . History of stomach ulcers   . Migraine     "sometimes 2-3 times/month" (09/02/2014)  . Stroke     "I think I had a mild stroke years ago" (09/02/2014)  . Fibromyalgia   . Chronic lower back pain   . Kidney stones   . Squamous cell carcinoma of nose   . Vocal cord paralysis   . Helicobacter pylori (H. pylori) infection     She has past surgical history that includes Tonsillectomy; Abdominal hysterectomy; Cesarean section (1960's X 1); Laparoscopic cholecystectomy; Excisional hemorrhoidectomy; Lumbar disc surgery (X 2); Cataract extraction w/ intraocular lens  implant, bilateral (Bilateral); Lithotripsy; Squamous cell carcinoma excision; and Kyphoplasty.   Her family history includes Coronary artery disease in her mother; Heart disease in her maternal grandmother and paternal grandmother; Lung cancer in her father. There is no history of Colon cancer.She reports that she has never smoked. She has never used smokeless tobacco. She reports that she does not drink alcohol or use  illicit drugs.  Current Outpatient Prescriptions on File Prior to Visit  Medication Sig Dispense Refill  . cholecalciferol (VITAMIN D) 1000 UNITS tablet Take 2,000 Units by mouth daily.    . Cyanocobalamin (VITAMIN B 12 PO) Take 1 tablet by mouth daily.    . diazepam (VALIUM) 5 MG tablet Take 5 mg by mouth every 6 (six) hours as needed.    . etanercept (ENBREL) 50 MG/ML injection Inject 50 mg into the skin once a week.    . feeding supplement, RESOURCE BREEZE, (RESOURCE BREEZE) LIQD Take 1 Container by mouth 3 (three) times daily between meals. 30 Container 3  . folic acid (FOLVITE) 1 MG tablet Take 1 tablet (1 mg total) by mouth daily. 30 tablet 3  . hydroxychloroquine (PLAQUENIL) 200 MG tablet TAKE 1 TABLET (200 MG TOTAL) BY MOUTH DAILY. 30 tablet 3  . levothyroxine (SYNTHROID, LEVOTHROID) 50 MCG tablet Take 1 tablet (50 mcg total) by mouth daily before breakfast. Repeat TSH is due now 30 tablet 0  . megestrol (MEGACE) 40 MG tablet Take 1 tablet (40 mg total) by mouth daily. 30 tablet 3  . mirtazapine (REMERON) 15 MG tablet Take 1 tablet (15 mg total) by mouth at bedtime. 30 tablet 5  . Nutritional Supplements (BOOST BREEZE) LIQD 530 cal / bottle--drink 3 a day between meals and between dinner and bedtime 90 Can 3  . oxyCODONE-acetaminophen (PERCOCET) 7.5-325 MG per tablet Take 1 tablet by mouth every 4 (four) hours as needed.    . pantoprazole (PROTONIX) 40 MG tablet Take  40 mg by mouth daily.    . zoledronic acid (RECLAST) 5 MG/100ML SOLN injection Inject 5 mg into the vein once. Done on 08/16/14     No current facility-administered medications on file prior to visit.     Objective:  Objective Physical Exam  Constitutional: She is oriented to person, place, and time. She appears well-developed and well-nourished.  HENT:  Head: Normocephalic and atraumatic.  Eyes: Conjunctivae and EOM are normal.  Neck: Normal range of motion. Neck supple. No JVD present. Carotid bruit is not present.  No thyromegaly present.  Cardiovascular: Normal rate, regular rhythm and normal heart sounds.   No murmur heard. Pulmonary/Chest: Effort normal and breath sounds normal. No respiratory distress. She has no wheezes. She has no rales. She exhibits no tenderness.  Musculoskeletal: She exhibits no edema.  Neurological: She is alert and oriented to person, place, and time.  Psychiatric: She has a normal mood and affect. Her behavior is normal.   BP 126/71 mmHg  Pulse 72  Temp(Src) 97.5 F (36.4 C) (Oral)  Ht 4\' 11"  (1.499 m)  Wt 94 lb 9.6 oz (42.91 kg)  BMI 19.10 kg/m2  SpO2 98% Wt Readings from Last 3 Encounters:  10/12/14 94 lb 9.6 oz (42.91 kg)  10/08/14 93 lb (42.185 kg)  09/29/14 93 lb (42.185 kg)     Lab Results  Component Value Date   WBC 5.0 09/02/2014   HGB 10.4* 09/02/2014   HCT 29.5* 09/02/2014   PLT 145* 09/02/2014   GLUCOSE 86 09/14/2014   ALT 20 09/01/2014   AST 25 09/01/2014   NA 143 09/14/2014   K 3.9 09/14/2014   CL 114* 09/14/2014   CREATININE 0.90 09/14/2014   BUN 15 09/14/2014   CO2 19 09/14/2014   TSH 0.026* 09/01/2014    Ct Abdomen Pelvis W Contrast  09/17/2014   CLINICAL DATA:  Reduced appetite. 30 pound weight loss over 2-3 years. Early satiety.  EXAM: CT ABDOMEN AND PELVIS WITH CONTRAST  TECHNIQUE: Multidetector CT imaging of the abdomen and pelvis was performed using the standard protocol following bolus administration of intravenous contrast.  CONTRAST:  100 cc Omnipaque 300  COMPARISON:  None.  FINDINGS: Lower chest:  Unremarkable  Hepatobiliary: Cholecystectomy.  Pancreas: Unremarkable  Spleen: Unremarkable  Adrenals/Urinary Tract: Benign 4.8 cm cyst exophytic from the right kidney upper pole. Hypodense 0.7 cm right kidney lower pole lesion, image 40 series 3, also hypodense 1.1 cm right mid kidney lesion, image 39 series 3, both probably cysts although density measurements are not reliable due to volume averaging.  Stomach/Bowel: Sigmoid  diverticulosis. No active diverticulitis identified.  Vascular/Lymphatic: Aortoiliac atherosclerotic vascular disease.  Reproductive: Uterus absent.  Ovaries unremarkable.  Other: No supplemental non-categorized findings.  Musculoskeletal: Dextroconvex lumbar scoliosis with multilevel mild grade 1 degenerative subluxations and multilevel considerable facet arthropathy, especially on the right at L4-5 and L5-S1. Vertebral augmentation at T11 with mild vertebral wedging at this level. Osseous foraminal stenosis most notably on the left at L2-3.  IMPRESSION: 1. A cause for weight loss is not identified. 2. There is a large benign-appearing right kidney upper pole exophytic cyst. Several other hypodense lesions in the right kidney are statistically likely to be cysts, but technically too small to characterize. If the patient has hematuria or if otherwise indicated, renal protocol MRI would probably be able to characterize these more reliably. 3. Sigmoid diverticulosis. 4.  Aortoiliac atherosclerotic vascular disease. 5. Dextroconvex lumbar scoliosis with considerable spondylosis and degenerative disc disease. Osseous foraminal  stenosis most notable on the left at L2-3. 6. Vertebral compression and augmentation at T11.   Electronically Signed   By: Van Clines M.D.   On: 09/17/2014 13:43     Assessment & Plan:  Plan I have discontinued Ms. Hendel's cephALEXin. I am also having her start on donepezil. Additionally, I am having her maintain her diazepam, etanercept, oxyCODONE-acetaminophen, pantoprazole, cholecalciferol, Cyanocobalamin (VITAMIN B 12 PO), feeding supplement (RESOURCE BREEZE), folic acid, megestrol, zoledronic acid, mirtazapine, BOOST BREEZE, hydroxychloroquine, levothyroxine, and metoprolol.  Meds ordered this encounter  Medications  . metoprolol (TOPROL-XL) 200 MG 24 hr tablet    Sig: Take 0.5 tablets (100 mg total) by mouth daily.    Dispense:  30 tablet    Refill:  5  . donepezil  (ARICEPT) 5 MG tablet    Sig: Take 1 tablet (5 mg total) by mouth at bedtime.    Dispense:  30 tablet    Refill:  5    Problem List Items Addressed This Visit    Hypothyroidism    Recheck labs today On synthroid      Relevant Medications   metoprolol (TOPROL-XL) 200 MG 24 hr tablet   FTT (failure to thrive) in adult    con't ensure between meals       BP (high blood pressure)   Relevant Medications   metoprolol (TOPROL-XL) 200 MG 24 hr tablet    Other Visit Diagnoses    Memory loss    -  Primary    Relevant Medications    donepezil (ARICEPT) 5 MG tablet    Other Relevant Orders    MR Brain W Wo Contrast    Ambulatory referral to Neurology    Failure to thrive in adult           Follow-up: Return in about 3 months (around 01/12/2015), or if symptoms worsen or fail to improve, for memory and weight loss.  Garnet Koyanagi, DO

## 2014-10-12 NOTE — Progress Notes (Signed)
Pre visit review using our clinic review tool, if applicable. No additional management support is needed unless otherwise documented below in the visit note. 

## 2014-10-12 NOTE — Patient Instructions (Signed)

## 2014-10-12 NOTE — Assessment & Plan Note (Signed)
con't ensure between meals

## 2014-10-12 NOTE — Assessment & Plan Note (Signed)
Recheck labs today On synthroid

## 2014-10-13 ENCOUNTER — Other Ambulatory Visit: Payer: Self-pay | Admitting: Family Medicine

## 2014-10-13 NOTE — Telephone Encounter (Signed)
Please see 10/12/14 thyroid panel and advise refill:  Medication name:  Name from pharmacy:  levothyroxine (SYNTHROID, LEVOTHROID) 50 MCG tablet LEVOTHYROXINE 50 MCG TABLET     The source prescription has been discontinued.    Sig: TAKE 1 TABLET BY MOUTH EVERY DAY BEFORE BREAKFAST    Dispense: 30 tablet   Refills: 0   Start: 10/13/2014   Class: Normal    Requested on: 09/21/2014    Originally ordered on: 03/01/2012 09/26/2014

## 2014-10-14 ENCOUNTER — Telehealth: Payer: Self-pay | Admitting: Family Medicine

## 2014-10-14 ENCOUNTER — Other Ambulatory Visit: Payer: Self-pay | Admitting: Family Medicine

## 2014-10-14 NOTE — Telephone Encounter (Signed)
Please review labs and advise if this refill is appropriate.     KP

## 2014-10-14 NOTE — Telephone Encounter (Signed)
error:[id number] ° °

## 2014-10-15 ENCOUNTER — Telehealth: Payer: Self-pay | Admitting: Family Medicine

## 2014-10-15 MED ORDER — LEVOTHYROXINE SODIUM 25 MCG PO TABS: 25.0000 ug | ORAL_TABLET | Freq: Every day | ORAL | Status: DC

## 2014-10-15 NOTE — Telephone Encounter (Signed)
error:[id number] ° °

## 2014-10-15 NOTE — Telephone Encounter (Signed)
Still hyperthyroid--- Lower synthroid to 25 mcg #30 1 po qd, 2 refills   Recheck 2 months TSH

## 2014-10-15 NOTE — Addendum Note (Signed)
Addended by: Ewing Schlein on: 10/15/2014 08:16 AM   Modules accepted: Orders

## 2014-10-16 ENCOUNTER — Ambulatory Visit (HOSPITAL_BASED_OUTPATIENT_CLINIC_OR_DEPARTMENT_OTHER)
Admission: RE | Admit: 2014-10-16 | Discharge: 2014-10-16 | Disposition: A | Discharge status: Home / Self Care | Payer: Medicare Other | Source: Ambulatory Visit | Attending: Family Medicine | Admitting: Family Medicine

## 2014-10-16 ENCOUNTER — Other Ambulatory Visit: Payer: Self-pay | Admitting: Family Medicine

## 2014-10-16 DIAGNOSIS — R42 Dizziness and giddiness: Secondary | ICD-10-CM | POA: Insufficient documentation

## 2014-10-16 DIAGNOSIS — R410 Disorientation, unspecified: Secondary | ICD-10-CM | POA: Diagnosis not present

## 2014-10-16 DIAGNOSIS — R413 Other amnesia: Secondary | ICD-10-CM

## 2014-10-19 ENCOUNTER — Telehealth: Payer: Self-pay | Admitting: Family Medicine

## 2014-10-19 NOTE — Telephone Encounter (Signed)
Notes Recorded by Rosalita Chessman, DO on 10/14/2014 at 9:09 PM Still hyperthyroid--- Lower synthroid to 25 mcg #30 1 po qd, 2 refills Recheck 2 months TSH Notes Recorded by Rosalita Chessman, DO on 10/16/2014 at 5:51 PM Normal age related changes only----neuro pending  Spoke with Antony Haste and made him aware of all of the above and he verbalized understanding, the Rx has been faxed and I gave him the number to Neuro to schedule the apt.     KP

## 2014-10-19 NOTE — Telephone Encounter (Signed)
Pt called for TSH lab results. Please call her son, Alden Feagan ([id number]), with the information. His # is [phone].

## 2014-10-20 ENCOUNTER — Telehealth: Payer: Self-pay | Admitting: Family Medicine

## 2014-10-20 NOTE — Telephone Encounter (Signed)
Called patient at [phone] [facility]) *Preferred* and left message for return call.

## 2014-10-20 NOTE — Telephone Encounter (Addendum)
Caller name: Zenia Resides "Cathleen Corti" [place]  Relation to pt: son  Call back number: [phone]   Reason for call: son states mother is not eating  In need of clinical advice. Pt was last seen 10/12/2014

## 2014-10-21 NOTE — Telephone Encounter (Signed)
Attempted to call patient with no answer.  °

## 2014-10-22 NOTE — Telephone Encounter (Signed)
Attempted to call patient- voicemail box has not been set up, unable to leave message.

## 2014-10-25 NOTE — Telephone Encounter (Signed)
Called pt son again today. No answer. Could not LM.

## 2014-10-29 ENCOUNTER — Emergency Department (HOSPITAL_BASED_OUTPATIENT_CLINIC_OR_DEPARTMENT_OTHER): Payer: Medicare Other

## 2014-10-29 ENCOUNTER — Inpatient Hospital Stay (HOSPITAL_BASED_OUTPATIENT_CLINIC_OR_DEPARTMENT_OTHER)
Admission: EM | Admit: 2014-10-29 | Discharge: 2014-10-30 | DRG: 641 | Disposition: A | Discharge status: Home / Self Care | Payer: Medicare Other | Attending: Family Medicine | Admitting: Family Medicine

## 2014-10-29 ENCOUNTER — Encounter (HOSPITAL_BASED_OUTPATIENT_CLINIC_OR_DEPARTMENT_OTHER): Payer: Self-pay

## 2014-10-29 DIAGNOSIS — K219 Gastro-esophageal reflux disease without esophagitis: Secondary | ICD-10-CM | POA: Diagnosis present

## 2014-10-29 DIAGNOSIS — M545 Low back pain: Secondary | ICD-10-CM | POA: Diagnosis present

## 2014-10-29 DIAGNOSIS — G8929 Other chronic pain: Secondary | ICD-10-CM | POA: Diagnosis present

## 2014-10-29 DIAGNOSIS — Z79899 Other long term (current) drug therapy: Secondary | ICD-10-CM | POA: Diagnosis not present

## 2014-10-29 DIAGNOSIS — M79651 Pain in right thigh: Secondary | ICD-10-CM | POA: Diagnosis present

## 2014-10-29 DIAGNOSIS — Z8673 Personal history of transient ischemic attack (TIA), and cerebral infarction without residual deficits: Secondary | ICD-10-CM | POA: Diagnosis not present

## 2014-10-29 DIAGNOSIS — E785 Hyperlipidemia, unspecified: Secondary | ICD-10-CM | POA: Diagnosis present

## 2014-10-29 DIAGNOSIS — Z85828 Personal history of other malignant neoplasm of skin: Secondary | ICD-10-CM | POA: Diagnosis not present

## 2014-10-29 DIAGNOSIS — I10 Essential (primary) hypertension: Secondary | ICD-10-CM | POA: Diagnosis present

## 2014-10-29 DIAGNOSIS — N281 Cyst of kidney, acquired: Secondary | ICD-10-CM | POA: Diagnosis present

## 2014-10-29 DIAGNOSIS — M810 Age-related osteoporosis without current pathological fracture: Secondary | ICD-10-CM | POA: Diagnosis present

## 2014-10-29 DIAGNOSIS — F419 Anxiety disorder, unspecified: Secondary | ICD-10-CM | POA: Diagnosis present

## 2014-10-29 DIAGNOSIS — E876 Hypokalemia: Principal | ICD-10-CM | POA: Diagnosis present

## 2014-10-29 DIAGNOSIS — M069 Rheumatoid arthritis, unspecified: Secondary | ICD-10-CM | POA: Diagnosis present

## 2014-10-29 DIAGNOSIS — Z961 Presence of intraocular lens: Secondary | ICD-10-CM | POA: Diagnosis present

## 2014-10-29 DIAGNOSIS — Z9842 Cataract extraction status, left eye: Secondary | ICD-10-CM | POA: Diagnosis not present

## 2014-10-29 DIAGNOSIS — Z9841 Cataract extraction status, right eye: Secondary | ICD-10-CM | POA: Diagnosis not present

## 2014-10-29 DIAGNOSIS — R627 Adult failure to thrive: Secondary | ICD-10-CM | POA: Diagnosis present

## 2014-10-29 DIAGNOSIS — N390 Urinary tract infection, site not specified: Secondary | ICD-10-CM | POA: Diagnosis present

## 2014-10-29 DIAGNOSIS — M1990 Unspecified osteoarthritis, unspecified site: Secondary | ICD-10-CM | POA: Diagnosis present

## 2014-10-29 DIAGNOSIS — M797 Fibromyalgia: Secondary | ICD-10-CM | POA: Diagnosis present

## 2014-10-29 DIAGNOSIS — F329 Major depressive disorder, single episode, unspecified: Secondary | ICD-10-CM | POA: Diagnosis present

## 2014-10-29 DIAGNOSIS — M79652 Pain in left thigh: Secondary | ICD-10-CM

## 2014-10-29 DIAGNOSIS — E039 Hypothyroidism, unspecified: Secondary | ICD-10-CM | POA: Diagnosis present

## 2014-10-29 DIAGNOSIS — E878 Other disorders of electrolyte and fluid balance, not elsewhere classified: Secondary | ICD-10-CM

## 2014-10-29 DIAGNOSIS — E870 Hyperosmolality and hypernatremia: Secondary | ICD-10-CM

## 2014-10-29 LAB — CBC WITH DIFFERENTIAL/PLATELET
BASOS ABS: 0.1 10*3/uL (ref 0.0–0.1)
BASOS PCT: 1 % (ref 0–1)
EOS ABS: 0.4 10*3/uL (ref 0.0–0.7)
Eosinophils Relative: 6 % — ABNORMAL HIGH (ref 0–5)
HEMATOCRIT: 38.6 % (ref 36.0–46.0)
HEMOGLOBIN: 13.6 g/dL (ref 12.0–15.0)
LYMPHS ABS: 3.6 10*3/uL (ref 0.7–4.0)
LYMPHS PCT: 52 % — AB (ref 12–46)
MCH: 31.7 pg (ref 26.0–34.0)
MCHC: 35.2 g/dL (ref 30.0–36.0)
MCV: 90 fL (ref 78.0–100.0)
Monocytes Absolute: 0.9 10*3/uL (ref 0.1–1.0)
Monocytes Relative: 13 % — ABNORMAL HIGH (ref 3–12)
NEUTROS ABS: 1.9 10*3/uL (ref 1.7–7.7)
Neutrophils Relative %: 28 % — ABNORMAL LOW (ref 43–77)
Platelets: 218 10*3/uL (ref 150–400)
RBC: 4.29 MIL/uL (ref 3.87–5.11)
RDW: 13.6 % (ref 11.5–15.5)
WBC: 6.9 10*3/uL (ref 4.0–10.5)

## 2014-10-29 LAB — BASIC METABOLIC PANEL
Anion gap: 13 (ref 5–15)
Anion gap: 8 (ref 5–15)
BUN: 14 mg/dL (ref 6–20)
BUN: 13 mg/dL (ref 6–20)
CALCIUM: 8 mg/dL — AB (ref 8.9–10.3)
CO2: 22 mmol/L (ref 22–32)
CO2: 21 mmol/L — ABNORMAL LOW (ref 22–32)
CREATININE: 0.81 mg/dL (ref 0.44–1.00)
Calcium: 9.5 mg/dL (ref 8.9–10.3)
Chloride: 112 mmol/L — ABNORMAL HIGH (ref 101–111)
Chloride: 119 mmol/L — ABNORMAL HIGH (ref 101–111)
Creatinine, Ser: 1.02 mg/dL — ABNORMAL HIGH (ref 0.44–1.00)
GFR calc Af Amer: >60 mL/min (ref >60)
GFR calc Af Amer: >60 mL/min (ref >60)
GFR calc non Af Amer: 53 mL/min — ABNORMAL LOW (ref >60)
GLUCOSE: 117 mg/dL — AB (ref 65–99)
GLUCOSE: 83 mg/dL (ref 65–99)
POTASSIUM: 2.1 mmol/L — AB (ref 3.5–5.1)
Potassium: 2.5 mmol/L — CL (ref 3.5–5.1)
SODIUM: 148 mmol/L — AB (ref 135–145)
Sodium: 147 mmol/L — ABNORMAL HIGH (ref 135–145)

## 2014-10-29 LAB — URINALYSIS, ROUTINE W REFLEX MICROSCOPIC
Bilirubin Urine: NEGATIVE
GLUCOSE, UA: NEGATIVE mg/dL
Hgb urine dipstick: NEGATIVE
KETONES UR: NEGATIVE mg/dL
NITRITE: NEGATIVE
PROTEIN: NEGATIVE mg/dL
Specific Gravity, Urine: 1.015 (ref 1.005–1.030)
Urobilinogen, UA: 0.2 mg/dL (ref 0.0–1.0)
pH: 6 (ref 5.0–8.0)

## 2014-10-29 LAB — CK: CK TOTAL: 68 U/L (ref 38–234)

## 2014-10-29 LAB — URINE MICROSCOPIC-ADD ON

## 2014-10-29 MED ORDER — ZOLEDRONIC ACID 5 MG/100ML IV SOLN: 5.0000 mg | Freq: Once | INTRAVENOUS | Status: DC

## 2014-10-29 MED ORDER — PANTOPRAZOLE SODIUM 40 MG PO TBEC
40.0000 mg | DELAYED_RELEASE_TABLET | Freq: Every day | ORAL | Status: DC
  Administered 2014-10-30: 40 mg via ORAL
  Filled 2014-10-29: qty 1

## 2014-10-29 MED ORDER — FOLIC ACID 1 MG PO TABS
1.0000 mg | ORAL_TABLET | Freq: Every day | ORAL | Status: DC
  Administered 2014-10-30: 1 mg via ORAL
  Filled 2014-10-29: qty 1

## 2014-10-29 MED ORDER — POTASSIUM CHLORIDE CRYS ER 20 MEQ PO TBCR
40.0000 meq | EXTENDED_RELEASE_TABLET | Freq: Once | ORAL | Status: AC
  Administered 2014-10-29: 40 meq via ORAL

## 2014-10-29 MED ORDER — ALUM & MAG HYDROXIDE-SIMETH 200-200-20 MG/5ML PO SUSP: 30.0000 mL | Freq: Four times a day (QID) | ORAL | Status: DC | PRN

## 2014-10-29 MED ORDER — ACETAMINOPHEN 650 MG RE SUPP: 650.0000 mg | Freq: Four times a day (QID) | RECTAL | Status: DC | PRN

## 2014-10-29 MED ORDER — DONEPEZIL HCL 5 MG PO TABS
5.0000 mg | ORAL_TABLET | Freq: Every day | ORAL | Status: DC
  Administered 2014-10-29: 5 mg via ORAL
  Filled 2014-10-29 (×2): qty 1

## 2014-10-29 MED ORDER — ONDANSETRON HCL 4 MG/2ML IJ SOLN: 4.0000 mg | Freq: Four times a day (QID) | INTRAMUSCULAR | Status: DC | PRN

## 2014-10-29 MED ORDER — POTASSIUM CHLORIDE CRYS ER 20 MEQ PO TBCR
40.0000 meq | EXTENDED_RELEASE_TABLET | Freq: Two times a day (BID) | ORAL | Status: DC
  Administered 2014-10-29 – 2014-10-30 (×2): 40 meq via ORAL
  Filled 2014-10-29 (×4): qty 2

## 2014-10-29 MED ORDER — ACETAMINOPHEN 325 MG PO TABS: 650.0000 mg | ORAL_TABLET | Freq: Four times a day (QID) | ORAL | Status: DC | PRN

## 2014-10-29 MED ORDER — ACETAMINOPHEN 325 MG PO TABS
650.0000 mg | ORAL_TABLET | Freq: Once | ORAL | Status: AC
  Administered 2014-10-29: 650 mg via ORAL
  Filled 2014-10-29: qty 2

## 2014-10-29 MED ORDER — METOPROLOL SUCCINATE ER 100 MG PO TB24
100.0000 mg | ORAL_TABLET | Freq: Every day | ORAL | Status: DC
  Administered 2014-10-30: 100 mg via ORAL
  Filled 2014-10-29: qty 1

## 2014-10-29 MED ORDER — POTASSIUM CHLORIDE 10 MEQ/100ML IV SOLN
10.0000 meq | INTRAVENOUS | Status: AC
  Administered 2014-10-29 – 2014-10-30 (×5): 10 meq via INTRAVENOUS
  Filled 2014-10-29 (×5): qty 100

## 2014-10-29 MED ORDER — MEGESTROL ACETATE 40 MG PO TABS
40.0000 mg | ORAL_TABLET | Freq: Every day | ORAL | Status: DC
  Administered 2014-10-30: 40 mg via ORAL
  Filled 2014-10-29: qty 1

## 2014-10-29 MED ORDER — SODIUM CHLORIDE 0.9 % IV BOLUS (SEPSIS)
1000.0000 mL | Freq: Once | INTRAVENOUS | Status: AC
  Administered 2014-10-29: 1000 mL via INTRAVENOUS

## 2014-10-29 MED ORDER — ONDANSETRON HCL 4 MG PO TABS: 4.0000 mg | ORAL_TABLET | Freq: Four times a day (QID) | ORAL | Status: DC | PRN

## 2014-10-29 MED ORDER — POTASSIUM CHLORIDE 20 MEQ/15ML (10%) PO SOLN
ORAL | Status: AC
  Filled 2014-10-29: qty 30

## 2014-10-29 MED ORDER — SENNOSIDES-DOCUSATE SODIUM 8.6-50 MG PO TABS: 1.0000 | ORAL_TABLET | Freq: Every evening | ORAL | Status: DC | PRN

## 2014-10-29 MED ORDER — MIRTAZAPINE 15 MG PO TABS
15.0000 mg | ORAL_TABLET | Freq: Every day | ORAL | Status: DC
  Administered 2014-10-29: 15 mg via ORAL
  Filled 2014-10-29 (×2): qty 1

## 2014-10-29 MED ORDER — ENOXAPARIN SODIUM 40 MG/0.4ML ~~LOC~~ SOLN
40.0000 mg | SUBCUTANEOUS | Status: DC
  Administered 2014-10-30: 40 mg via SUBCUTANEOUS
  Filled 2014-10-29 (×2): qty 0.4

## 2014-10-29 MED ORDER — SODIUM CHLORIDE 0.45 % IV SOLN
INTRAVENOUS | Status: AC
  Administered 2014-10-29: 17:00:00 via INTRAVENOUS

## 2014-10-29 MED ORDER — POTASSIUM CHLORIDE 10 MEQ/100ML IV SOLN
10.0000 meq | INTRAVENOUS | Status: AC
  Administered 2014-10-29 (×3): 10 meq via INTRAVENOUS
  Filled 2014-10-29 (×2): qty 100

## 2014-10-29 MED ORDER — LEVOTHYROXINE SODIUM 25 MCG PO TABS
25.0000 ug | ORAL_TABLET | Freq: Every day | ORAL | Status: DC
  Administered 2014-10-30: 25 ug via ORAL
  Filled 2014-10-29 (×2): qty 1

## 2014-10-29 MED ORDER — POTASSIUM CHLORIDE 10 MEQ/100ML IV SOLN
INTRAVENOUS | Status: AC
  Filled 2014-10-29: qty 100

## 2014-10-29 MED ORDER — HYDROXYCHLOROQUINE SULFATE 200 MG PO TABS
200.0000 mg | ORAL_TABLET | Freq: Every day | ORAL | Status: DC
  Administered 2014-10-30: 200 mg via ORAL
  Filled 2014-10-29: qty 1

## 2014-10-29 MED ORDER — BOOST / RESOURCE BREEZE PO LIQD
1.0000 | Freq: Three times a day (TID) | ORAL | Status: DC
  Administered 2014-10-30 (×2): 1 via ORAL

## 2014-10-29 NOTE — Progress Notes (Signed)
Admission note:  Mental Orientation: Pt alert and oriented x 4 Telemetry: Telemetry box 6e05 applied. CCMD notified. Assessment: Completed, see doc flowsheets Skin: Skin dry and intact. Skin assessment completed with Alden Server, RN IV: Right AC IV. 1/2 normal saline infusing at 53ml/hr. Site clean, dry and intact. Pain: Pt states no pain at this time Tubes: N/A Safety Measures: Bed in lowest position, non-slip socks placed, bed alarm activated, and call light within reach Fall Prevention Safety Plan: Reviewed with patient Admission Screening: Completed, see doc flowsheets [phone] Orientation: Patient has been oriented to the unit, staff and to the room. Pt lying comfortably in bed with no further needs stated at this time. Call light within reach, will continue to monitor.  Shelbie Hutching, RN, BSN

## 2014-10-29 NOTE — ED Notes (Signed)
While assessing patient, HR on monitor down to 30 - pt asymptomatic, states she feels fine, radial pulse 30 as well - notified EDP - EKG obtained.

## 2014-10-29 NOTE — H&P (Signed)
History and Physical  Ann Gonzales MVV:[id number] DOB: [birth year] DOA: 10/29/2014  Referring physician: Dr Maryan Rued, ED physician PCP: Garnet Koyanagi, DO   Chief Complaint: Leg Pain  HPI: Ann Gonzales is a 74 y.o. female  With a history of RA on immunologics, GERD, osteoarthritis, osteoporosis, hypothyroidism, hypertension, failure to thrive who went to the [facility] ED for right greater than left leg pain that started 2 days ago after a fall.  Pain is severe, non-radiating and located in upper thighs bilaterally.  While there, she was found to be severely hypokalemic.  She received potassium supplementation there, but remained only mildly improved on repeat.     Review of Systems:   Pt complains of leg pain.  Pt denies any fevers, chills, nausea, vomiting, diarrhea, constipation, abdominal pain, chest pain, shortness of breath, palpitations, rash.  Review of systems are otherwise negative  Past Medical History  Diagnosis Date  . Hypothyroidism   . Hypertension   . Osteoarthritis   . Colitis   . Rheumatoid arthritis(714.0)   . GERD (gastroesophageal reflux disease)   . Anxiety   . History of stomach ulcers   . Migraine     "sometimes 2-3 times/month" (09/02/2014)  . Stroke     "I think I had a mild stroke years ago" (09/02/2014)  . Fibromyalgia   . Chronic lower back pain   . Kidney stones   . Squamous cell carcinoma of nose   . Vocal cord paralysis   . Helicobacter pylori (H. pylori) infection    Past Surgical History  Procedure Laterality Date  . Tonsillectomy    . Abdominal hysterectomy    . Cesarean section  1960's X 1  . Laparoscopic cholecystectomy    . Excisional hemorrhoidectomy    . Lumbar disc surgery  X 2    "lower back OR; don't know what they did"  . Cataract extraction w/ intraocular lens  implant, bilateral Bilateral   . Lithotripsy    . Squamous cell carcinoma excision      "nose"  . Kyphoplasty      Dr.Brown   Social History:  reports that she has  never smoked. She has never used smokeless tobacco. She reports that she does not drink alcohol or use illicit drugs. Patient lives at home & is able to participate in activities of daily living   Allergies  Allergen Reactions  . Ciprofloxacin Rash    Doesn't remember   . Leflunomide Diarrhea and Rash  . Methotrexate Rash    Mouth ulcers  . Nitrofurantoin Rash  . Penicillins Rash  . Sulfa Antibiotics Rash and Anxiety  . Tetanus Toxoid Adsorbed Itching  . Tetanus Toxoids Rash, Other (See Comments) and Itching    Family History  Problem Relation Age of Onset  . Coronary artery disease Mother   . Lung cancer Father   . Heart disease Maternal Grandmother   . Heart disease Paternal Grandmother   . Colon cancer Neg Hx       Prior to Admission medications   Medication Sig Start Date End Date Taking? Authorizing Provider  cholecalciferol (VITAMIN D) 1000 UNITS tablet Take 2,000 Units by mouth daily.   Yes Historical Provider, MD  Cyanocobalamin (VITAMIN B 12 PO) Take 1 tablet by mouth daily.   Yes Historical Provider, MD  diazepam (VALIUM) 5 MG tablet Take 5 mg by mouth every 6 (six) hours as needed.   Yes Historical Provider, MD  donepezil (ARICEPT) 5 MG tablet Take 1 tablet (5 mg total)  by mouth at bedtime. 10/12/14  Yes Yvonne R Lowne, DO  etanercept (ENBREL) 50 MG/ML injection Inject 50 mg into the skin once a week.   Yes Historical Provider, MD  feeding supplement, RESOURCE BREEZE, (RESOURCE BREEZE) LIQD Take 1 Container by mouth 3 (three) times daily between meals. 09/02/14  Yes Florencia Reasons, MD  folic acid (FOLVITE) 1 MG tablet Take 1 tablet (1 mg total) by mouth daily. 09/02/14  Yes Florencia Reasons, MD  hydroxychloroquine (PLAQUENIL) 200 MG tablet TAKE 1 TABLET (200 MG TOTAL) BY MOUTH DAILY. 10/05/14  Yes Rosalita Chessman, DO  levothyroxine (LEVOTHROID) 25 MCG tablet Take 1 tablet (25 mcg total) by mouth daily before breakfast. 10/15/14  Yes Alferd Apa Lowne, DO  megestrol (MEGACE) 40 MG tablet Take 1  tablet (40 mg total) by mouth daily. 09/02/14  Yes Florencia Reasons, MD  metoprolol (TOPROL-XL) 200 MG 24 hr tablet Take 0.5 tablets (100 mg total) by mouth daily. 10/12/14  Yes Yvonne R Lowne, DO  mirtazapine (REMERON) 15 MG tablet Take 1 tablet (15 mg total) by mouth at bedtime. 09/14/14  Yes Rosalita Chessman, DO  Nutritional Supplements (BOOST BREEZE) LIQD 530 cal / bottle--drink 3 a day between meals and between dinner and bedtime 09/14/14  Yes Alferd Apa Lowne, DO  oxyCODONE-acetaminophen (PERCOCET) 7.5-325 MG per tablet Take 1 tablet by mouth every 4 (four) hours as needed.   Yes Historical Provider, MD  pantoprazole (PROTONIX) 40 MG tablet Take 40 mg by mouth daily.   Yes Historical Provider, MD  zoledronic acid (RECLAST) 5 MG/100ML SOLN injection Inject 5 mg into the vein once. Done on 08/16/14   Yes Historical Provider, MD    Physical Exam: BP 117/52 mmHg  Pulse 68  Temp(Src) 99.1 F (37.3 C) (Oral)  Resp 18  Ht 4\' 11"  (1.499 m)  Wt 47.7 kg (105 lb 2.6 oz)  BMI 21.23 kg/m2  SpO2 100%  General: Older caucasian female. Awake and alert and oriented x3. No acute cardiopulmonary distress.  Eyes: Pupils equal, round, reactive to light. Extraocular muscles are intact. Sclerae anicteric and noninjected.  ENT: Moist mucosal membranes. No mucosal lesions. Teeth in good repair  Neck: Neck supple without lymphadenopathy. No carotid bruits. No masses palpated.  Cardiovascular: Regular rate with normal S1-S2 sounds. No murmurs, rubs, gallops auscultated. No JVD.  Respiratory: Good respiratory effort with no wheezes, rales, rhonchi. Lungs clear to auscultation bilaterally.  Abdomen: Soft, nontender, nondistended. Active bowel sounds. No masses or hepatosplenomegaly  Skin: Dry, warm to touch. 2+ dorsalis pedis and radial pulses. Musculoskeletal: No calf or leg pain. All major joints not erythematous nontender.  Psychiatric: Intact judgment and insight.  Neurologic: No focal neurological deficits. Cranial nerves  II through XII are grossly intact.           Labs on Admission:  Basic Metabolic Panel:  Recent Labs Lab 10/29/14 1002 10/29/14 1445  NA 147* 148*  K 2.1* 2.5*  CL 112* 119*  CO2 22 21*  GLUCOSE 117* 83  BUN 14 13  CREATININE 1.02* 0.81  CALCIUM 9.5 8.0*   Liver Function Tests: No results for input(s): AST, ALT, ALKPHOS, BILITOT, PROT, ALBUMIN in the last 168 hours. No results for input(s): LIPASE, AMYLASE in the last 168 hours. No results for input(s): AMMONIA in the last 168 hours. CBC:  Recent Labs Lab 10/29/14 0949  WBC 6.9  NEUTROABS 1.9  HGB 13.6  HCT 38.6  MCV 90.0  PLT 218   Cardiac Enzymes:  Recent  Labs Lab 10/29/14 1002  CKTOTAL 68    BNP (last 3 results) No results for input(s): BNP in the last 8760 hours.  ProBNP (last 3 results) No results for input(s): PROBNP in the last 8760 hours.  CBG: No results for input(s): GLUCAP in the last 168 hours.  Radiological Exams on Admission: Dg Lumbar Spine Complete  10/29/2014   CLINICAL DATA:  Pain following fall. Lumbago with right lower extremity radicular symptoms  EXAM: LUMBAR SPINE - COMPLETE 4+ VIEW  COMPARISON:  CT abdomen and pelvis with bony reformats Sep 17, 2014  FINDINGS: Frontal, lateral, spot lumbosacral lateral, and bilateral oblique views obtained. There are 5 non-rib-bearing lumbar type vertebral bodies. There is lumbar dextroscoliosis. The patient has undergone prior kyphoplasty procedure at T11, unchanged. In the lumbar region, there is no fracture or spondylolisthesis. There is moderately severe disc space narrowing at L1-2, L2-3, and L3-4 with slightly milder narrowing at L4-5. There is facet osteoarthritic change at L4-5 on the right at L5-S1 bilaterally.  IMPRESSION: Scoliosis. Multilevel osteoarthritic change. Prior fracture at T11, status post kyphoplasty. No acute fracture or spondylolisthesis.   Electronically Signed   By: Lowella Grip III M.D.   On: 10/29/2014 10:32   Dg  Pelvis 1-2 Views  10/29/2014   CLINICAL DATA:  Fall this morning with low back pain and right leg weakness.  EXAM: PELVIS - 1-2 VIEW  COMPARISON:  09/01/2014  FINDINGS: There is no evidence of pelvic fracture or diastasis. No pelvic bone lesions are seen. No evidence hip fracture dislocation.  Pars defect on the right at L5 with advanced facet arthropathy.  IMPRESSION: 1. No acute findings. 2. If unilateral hip pain, recommend dedicated imaging.   Electronically Signed   By: Monte Fantasia M.D.   On: 10/29/2014 10:33     Assessment/Plan Present on Admission:  . Hypokalemia . Bilateral thigh pain  Admit for observation Tele Potassium replacement PT consult in AM. Check BMP in AM  DVT prophylaxis: lovenox  Consultants: none  Code Status: Full  Family Communication: none    Disposition Plan: home following replacement    Truett Mainland, DO [facility] Pager [phone]

## 2014-10-29 NOTE — ED Notes (Signed)
Pt reports pain to bilateral thighs/legs - reports yesterday the right was greater than the left - states that last night the left leg "gave out" and she fell - reports left leg pain worse since falling.

## 2014-10-29 NOTE — Progress Notes (Signed)
Patient came from [place].  Alert and oriented, put her in bed and made her comfortable.  Called for dinner tray from kitchen.  Given some Boost per patient request.

## 2014-10-29 NOTE — ED Provider Notes (Addendum)
CSN: [id number]     Arrival date & time 10/29/14  0932 History   First MD Initiated Contact with Patient 10/29/14 [phone]     Chief Complaint  Patient presents with  . Fall     (Consider location/radiation/quality/duration/timing/severity/associated sxs/prior Treatment) HPI Comments: Patient with a significant history for rheumatoid arthritis on reclast injections every Tuesday, chronic low back pain status post lumbar disc surgery 2, fibromyalgia, failure to thrive, osteoarthritis who presents today with a 2 day history of worsening upper leg pain. It started 2 days ago with pain in the right quadriceps area that is much worse when attempting to walk. This morning at 4 AM when she attempted to get out of bed she put weight on her left leg and she developed severe pain between the knee and hip in both legs causing her knee is to buckle and she fell to the floor. She denies any trauma prior to the tenderness starting. She has had no medication changes in the last 1 month. She denies swelling, fever, joint pain or swelling. No prior history of PE or DVT. She was in the hospital in April for failure to thrive has had no other hospitalizations. No recent surgeries. Patient denies ever having symptoms like this before.  Normal urinary and bowel habits. No localized back pain different than her chronic pain.  The history is provided by the patient.    Past Medical History  Diagnosis Date  . Hypothyroidism   . Hypertension   . Osteoarthritis   . Colitis   . Rheumatoid arthritis(714.0)   . GERD (gastroesophageal reflux disease)   . Anxiety   . History of stomach ulcers   . Migraine     "sometimes 2-3 times/month" (09/02/2014)  . Stroke     "I think I had a mild stroke years ago" (09/02/2014)  . Fibromyalgia   . Chronic lower back pain   . Kidney stones   . Squamous cell carcinoma of nose   . Vocal cord paralysis   . Helicobacter pylori (H. pylori) infection    Past Surgical History    Procedure Laterality Date  . Tonsillectomy    . Abdominal hysterectomy    . Cesarean section  1960's X 1  . Laparoscopic cholecystectomy    . Excisional hemorrhoidectomy    . Lumbar disc surgery  X 2    "lower back OR; don't know what they did"  . Cataract extraction w/ intraocular lens  implant, bilateral Bilateral   . Lithotripsy    . Squamous cell carcinoma excision      "nose"  . Kyphoplasty      Dr.Brown   Family History  Problem Relation Age of Onset  . Coronary artery disease Mother   . Lung cancer Father   . Heart disease Maternal Grandmother   . Heart disease Paternal Grandmother   . Colon cancer Neg Hx    History  Substance Use Topics  . Smoking status: Never Smoker   . Smokeless tobacco: Never Used  . Alcohol Use: No   OB History    No data available     Review of Systems  All other systems reviewed and are negative.     Allergies  Ciprofloxacin; Leflunomide; Methotrexate; Nitrofurantoin; Penicillins; Sulfa antibiotics; Tetanus toxoid adsorbed; and Tetanus toxoids  Home Medications   Prior to Admission medications   Medication Sig Start Date End Date Taking? Authorizing Provider  cholecalciferol (VITAMIN D) 1000 UNITS tablet Take 2,000 Units by mouth daily.   Yes  Historical Provider, MD  Cyanocobalamin (VITAMIN B 12 PO) Take 1 tablet by mouth daily.   Yes Historical Provider, MD  diazepam (VALIUM) 5 MG tablet Take 5 mg by mouth every 6 (six) hours as needed.   Yes Historical Provider, MD  donepezil (ARICEPT) 5 MG tablet Take 1 tablet (5 mg total) by mouth at bedtime. 10/12/14  Yes Yvonne R Lowne, DO  etanercept (ENBREL) 50 MG/ML injection Inject 50 mg into the skin once a week.   Yes Historical Provider, MD  feeding supplement, RESOURCE BREEZE, (RESOURCE BREEZE) LIQD Take 1 Container by mouth 3 (three) times daily between meals. 09/02/14  Yes Florencia Reasons, MD  folic acid (FOLVITE) 1 MG tablet Take 1 tablet (1 mg total) by mouth daily. 09/02/14  Yes Florencia Reasons, MD   hydroxychloroquine (PLAQUENIL) 200 MG tablet TAKE 1 TABLET (200 MG TOTAL) BY MOUTH DAILY. 10/05/14  Yes Rosalita Chessman, DO  levothyroxine (LEVOTHROID) 25 MCG tablet Take 1 tablet (25 mcg total) by mouth daily before breakfast. 10/15/14  Yes Alferd Apa Lowne, DO  megestrol (MEGACE) 40 MG tablet Take 1 tablet (40 mg total) by mouth daily. 09/02/14  Yes Florencia Reasons, MD  metoprolol (TOPROL-XL) 200 MG 24 hr tablet Take 0.5 tablets (100 mg total) by mouth daily. 10/12/14  Yes Yvonne R Lowne, DO  mirtazapine (REMERON) 15 MG tablet Take 1 tablet (15 mg total) by mouth at bedtime. 09/14/14  Yes Rosalita Chessman, DO  Nutritional Supplements (BOOST BREEZE) LIQD 530 cal / bottle--drink 3 a day between meals and between dinner and bedtime 09/14/14  Yes Alferd Apa Lowne, DO  oxyCODONE-acetaminophen (PERCOCET) 7.5-325 MG per tablet Take 1 tablet by mouth every 4 (four) hours as needed.   Yes Historical Provider, MD  pantoprazole (PROTONIX) 40 MG tablet Take 40 mg by mouth daily.   Yes Historical Provider, MD  zoledronic acid (RECLAST) 5 MG/100ML SOLN injection Inject 5 mg into the vein once. Done on 08/16/14   Yes Historical Provider, MD   There were no vitals taken for this visit. Physical Exam  Constitutional: She is oriented to person, place, and time. She appears well-developed and well-nourished. No distress.  Very thin well groomed female  HENT:  Head: Normocephalic and atraumatic.  Mouth/Throat: Oropharynx is clear and moist.  Eyes: Conjunctivae and EOM are normal. Pupils are equal, round, and reactive to light.  Neck: Normal range of motion. Neck supple.  Cardiovascular: Regular rhythm and intact distal pulses.  Bradycardia present.   No murmur heard. Pulmonary/Chest: Effort normal and breath sounds normal. No respiratory distress. She has no wheezes. She has no rales.  Abdominal: Soft. She exhibits no distension. There is no tenderness. There is no rebound and no guarding.  Musculoskeletal: Normal range of motion.  She exhibits tenderness. She exhibits no edema.       Back:       Legs: Tenderness in the anterior and medial portions of bilateral upper legs. Tenderness is increased with hip flexion when patient sits upright as well as with passive range of motion. No knee joint swelling or pain with range of motion. No hip tenderness. Pain seems to be localized to the quadriceps muscles bilaterally. There is no swelling, erythema, fluctuance or induration. Compartments are soft.  Neurological: She is alert and oriented to person, place, and time.  Skin: Skin is warm and dry. No rash noted. No erythema.  Psychiatric: She has a normal mood and affect. Her behavior is normal.  Nursing note and vitals  reviewed.   ED Course  Procedures (including critical care time) Labs Review Labs Reviewed  BASIC METABOLIC PANEL - Abnormal; Notable for the following:    Sodium 147 (*)    Potassium 2.1 (*)    Chloride 112 (*)    Glucose, Bld 117 (*)    Creatinine, Ser 1.02 (*)    GFR calc non Af Amer 53 (*)    All other components within normal limits  CBC WITH DIFFERENTIAL/PLATELET - Abnormal; Notable for the following:    Neutrophils Relative % 28 (*)    Lymphocytes Relative 52 (*)    Monocytes Relative 13 (*)    Eosinophils Relative 6 (*)    All other components within normal limits  URINALYSIS, ROUTINE W REFLEX MICROSCOPIC (NOT AT [facility]) - Abnormal; Notable for the following:    Leukocytes, UA TRACE (*)    All other components within normal limits  URINE MICROSCOPIC-ADD ON - Abnormal; Notable for the following:    Bacteria, UA FEW (*)    All other components within normal limits  BASIC METABOLIC PANEL - Abnormal; Notable for the following:    Sodium 148 (*)    Potassium 2.5 (*)    Chloride 119 (*)    CO2 21 (*)    Calcium 8.0 (*)    All other components within normal limits  CK    Imaging Review No results found.   EKG Interpretation   Date/Time:  Friday October 29 2014 09:58:55 EDT Ventricular  Rate:  68 PR Interval:  160 QRS Duration: 78 QT Interval:  466 QTC Calculation: 495 R Axis:   61 Text Interpretation:  Sinus rhythm with frequent Premature ventricular  complexes in a pattern of bigeminy Nonspecific ST and T wave abnormality  Prolonged QT new bigeminy Confirmed by Maryan Rued  MD, Loree Fee ([id number]) on  10/29/2014 10:00:36 AM      MDM   Final diagnoses:  Hypokalemia  Hypernatremia  Hyperchloremia    Patient here complaining of bilateral quadriceps pain which started 2 days ago in the right leg and is now moved to include both legs. Severe pain when attempting to ambulate. She denies swelling, weakness, fever, change of medication in the last 1 month, recent falls or trauma. Patient does have a history of rheumatoid arthritis and currently takes re-classed injections every Tuesday. The dose has not changed and she has not missed any doses. She denies ever having symptoms like this before. She does have a significant history for lumbar disc surgery 2 years ago but denies any back pain. No urinary or bowel symptoms. Pain is reproduced with passive and active movement of the legs with palpation over the quadriceps muscle and tendon. No joint tenderness or evidence of septic joint. No lower extremity edema or concern for DVT.  This could be related to a lumbar radiculopathy versus myalgias.  Patient is afebrile here and well appearing. She is able to answer all of her own history.  Pelvic and lumbar plain films pending.  CK, CBC, BMP, UA. Patient given Tylenol.  10:23 AM Pt had EKG done by the nurse due to bradycardia.  However pt states she feels fine and denies CP, SOB or dizziness.  Pt is on toprol 200mg  24 hr tablet.  EKG shows bigeminy. Do not feel that it has anything to do the leg pain today.  11:06 AM Patient found to be hypokalemic today at 2.1 which is most likely the explanation of her EKG as well as her muscle pain. Patient states  that she is supposed be taking  potassium but ran out about one month ago and has not gotten a new prescription.  Will give patient 3 runs of potassium and oral potassium. Patient and husband choose to get the potassium here and recheck. If it remains low she is willing to be admitted to the hospital.  Hypokalemia is most likely due to not taking her oral potassium as well as poor oral intake.  3:21 PM On repeat pt's HR improved to the 70's.  Repeat potassium still 2.5.  Pt still feeling unwell and having some leg pain.  Will admit for further care.  CRITICAL CARE Performed by: Blanchie Dessert Total critical care time: 30 Critical care time was exclusive of separately billable procedures and treating other patients. Critical care was necessary to treat or prevent imminent or life-threatening deterioration. Critical care was time spent personally by me on the following activities: development of treatment plan with patient and/or surrogate as well as nursing, discussions with consultants, evaluation of patient's response to treatment, examination of patient, obtaining history from patient or surrogate, ordering and performing treatments and interventions, ordering and review of laboratory studies, ordering and review of radiographic studies, pulse oximetry and re-evaluation of patient's condition.   Blanchie Dessert, MD 10/29/14 [id number]  Blanchie Dessert, MD 10/29/14 [phone]

## 2014-10-29 NOTE — Progress Notes (Signed)
Request for admission to telemetry bed. Pt noted to have hypokalemia and some PVC's on tele, has been asymptomatic but complained of right extremity pain, better after supplementing with K.   Faye Ramsay, MD  [facility] Pager [phone]  If 7PM-7AM, please contact night-coverage [email or web address] Password [email or web address]

## 2014-10-29 NOTE — ED Notes (Signed)
CRITICAL VALUE ALERT  Critical value received:  Potassium 2.5  Date of notification:  10/29/14  Time of notification:  1520  Critical value read back: YES  Nurse who received alert:  Quay Burow, RN  MD notified (1st page):  1520  Time of first page:  1520  MD notified (2nd page):  Time of second page:  Responding MD:  Maryan Rued  Time MD responded:  1520

## 2014-10-30 DIAGNOSIS — M79652 Pain in left thigh: Secondary | ICD-10-CM

## 2014-10-30 DIAGNOSIS — M79651 Pain in right thigh: Secondary | ICD-10-CM

## 2014-10-30 DIAGNOSIS — E876 Hypokalemia: Principal | ICD-10-CM

## 2014-10-30 LAB — BASIC METABOLIC PANEL
Anion gap: 8 (ref 5–15)
BUN: 8 mg/dL (ref 6–20)
CHLORIDE: 118 mmol/L — AB (ref 101–111)
CO2: 19 mmol/L — AB (ref 22–32)
CREATININE: 0.71 mg/dL (ref 0.44–1.00)
Calcium: 8.2 mg/dL — ABNORMAL LOW (ref 8.9–10.3)
GFR calc Af Amer: >60 mL/min (ref >60)
GFR calc non Af Amer: >60 mL/min (ref >60)
Glucose, Bld: 79 mg/dL (ref 65–99)
POTASSIUM: 3.8 mmol/L (ref 3.5–5.1)
Sodium: 145 mmol/L (ref 135–145)

## 2014-10-30 MED ORDER — POTASSIUM CHLORIDE ER 20 MEQ PO TBCR: 10.0000 meq | EXTENDED_RELEASE_TABLET | Freq: Every day | ORAL | Status: DC

## 2014-10-30 NOTE — Discharge Summary (Signed)
Past Medical History  Diagnosis Date    PHQ-9  Recurrent UTI prior suppressive Kephlex under Dr, Harold Hedge + R Renal cyst  Physician Discharge Summary  Ann Gonzales BVQ:[id number] DOB: 03/16/1941 DOA: 10/29/2014  PCP: Garnet Koyanagi, DO  Admit date: 10/29/2014 Discharge date: 10/30/2014  Time spent: 35 minutes  Recommendations for Outpatient Follow-up:  1. Needs bmet 1 week 2. Added Kdur this admission for low Potassium--felt low K was probably result of low solute diet/FTT  3. recommended SNF-patient requested to d/c home and felt could manage with the same  Discharge Diagnoses:  Active Problems:   Hypokalemia   Bilateral thigh pain   Discharge Condition: stable  Diet recommendation: regular  Filed Weights   10/29/14 0940 10/29/14 2055  Weight: 42.185 kg (93 lb) 47.7 kg (105 lb 2.6 oz)    History of present illness:  74 y/o ? h/o  GERD (gastroesophageal reflux disease)  Depression  Vertebral basilar insufficiencRA (rheumatoid arthritis) Dr. Abner Greenspan [facility] follows her-On biologics Hypothyroidism  prior Vocal cord paralysis Hyperlipidemia Hypertension Osteoporosis 08/23/2011 Nephrolithiasis  chronic UTI previously on suppressive Tx admitted overnight 6/24 to [facility] with severe hypokalemia She was not noted to be on any diuretic and had sustained an accidental fall-she was not able to recollect the circumstances of the fall Noted some R knee pain and swelling on admission and characeyterized this as severe, but felt much better on day of d/c She was seen by Therapy who recommended d/c to SNF But as she ambulated around the unit without issue felt she would be able to manage at home with the help of her husband K was 3.2 on d/c home     Discharge Exam: Filed Vitals:   10/30/14 0857  BP: 113/59  Pulse: 82  Temp: 98.8 F (37.1 C)  Resp: 18    General: eomi ncat Cardiovascular: s1 s 2no m/r/g Respiratory: clear no added sound  Discharge Instructions   Discharge  Instructions    Diet - low sodium heart healthy    Complete by:  As directed      Discharge instructions    Complete by:  As directed   Take potassium daily until you get lab work at Dr. Ivy Lynn Follow up as OP with Dr. Maurie Boettcher in 1 week We will ask home health to see you as an OP     Increase activity slowly    Complete by:  As directed           Current Discharge Medication List    START taking these medications   Details  potassium chloride 20 MEQ TBCR Take 10 mEq by mouth daily. Qty: 10 tablet, Refills: 0      CONTINUE these medications which have NOT CHANGED   Details  acetaminophen (TYLENOL) 500 MG tablet Take 1,000 mg by mouth every 6 (six) hours as needed for mild pain.    cholecalciferol (VITAMIN D) 1000 UNITS tablet Take 1,000 Units by mouth daily.     Cyanocobalamin (VITAMIN B 12 PO) Take 1 tablet by mouth daily.    diazepam (VALIUM) 5 MG tablet Take 5 mg by mouth every 6 (six) hours as needed for anxiety.     donepezil (ARICEPT) 5 MG tablet Take 1 tablet (5 mg total) by mouth at bedtime. Qty: 30 tablet, Refills: 5   Associated Diagnoses: Memory loss    etanercept (ENBREL) 50 MG/ML injection Inject 50 mg into the skin once a week.    hydroxychloroquine (PLAQUENIL) 200 MG tablet TAKE  1 TABLET (200 MG TOTAL) BY MOUTH DAILY. Qty: 30 tablet, Refills: 3    Liniments (SALONPAS PAIN RELIEF PATCH) PADS Apply 1 each topically daily as needed (for pain).    megestrol (MEGACE) 40 MG tablet Take 1 tablet (40 mg total) by mouth daily. Qty: 30 tablet, Refills: 3    metoprolol (TOPROL-XL) 200 MG 24 hr tablet Take 0.5 tablets (100 mg total) by mouth daily. Qty: 30 tablet, Refills: 5   Associated Diagnoses: Essential hypertension    mirtazapine (REMERON) 15 MG tablet Take 1 tablet (15 mg total) by mouth at bedtime. Qty: 30 tablet, Refills: 5   Associated Diagnoses: Depression; Failure to thrive in adult    Nutritional Supplements (BOOST BREEZE) LIQD 530 cal / bottle--drink  3 a day between meals and between dinner and bedtime Qty: 90 Can, Refills: 3   Associated Diagnoses: Failure to thrive in adult    oxyCODONE-acetaminophen (PERCOCET) 7.5-325 MG per tablet Take 1 tablet by mouth every 4 (four) hours as needed for moderate pain.     pantoprazole (PROTONIX) 40 MG tablet Take 40 mg by mouth daily.    [employer] B-1 100 MG tablet Take 100 mg by mouth daily. Refills: 0    folic acid (FOLVITE) 1 MG tablet Take 1 tablet (1 mg total) by mouth daily. Qty: 30 tablet, Refills: 3    levothyroxine (SYNTHROID, LEVOTHROID) 50 MCG tablet Take 25 mcg by mouth daily before breakfast.  Refills: 0      STOP taking these medications     cephALEXin (KEFLEX) 250 MG capsule        Allergies  Allergen Reactions  . Ciprofloxacin Rash    Doesn't remember   . Leflunomide Diarrhea and Rash  . Methotrexate Rash    Mouth ulcers  . Nitrofurantoin Rash  . Penicillins Rash  . Salonpas Itching and Rash  . Sulfa Antibiotics Rash and Anxiety  . Tetanus Toxoid Adsorbed Itching  . Tetanus Toxoids Rash, Other (See Comments) and Itching      The results of significant diagnostics from this hospitalization (including imaging, microbiology, ancillary and laboratory) are listed below for reference.    Significant Diagnostic Studies: Dg Lumbar Spine Complete  10/29/2014   CLINICAL DATA:  Pain following fall. Lumbago with right lower extremity radicular symptoms  EXAM: LUMBAR SPINE - COMPLETE 4+ VIEW  COMPARISON:  CT abdomen and pelvis with bony reformats Sep 17, 2014  FINDINGS: Frontal, lateral, spot lumbosacral lateral, and bilateral oblique views obtained. There are 5 non-rib-bearing lumbar type vertebral bodies. There is lumbar dextroscoliosis. The patient has undergone prior kyphoplasty procedure at T11, unchanged. In the lumbar region, there is no fracture or spondylolisthesis. There is moderately severe disc space narrowing at L1-2, L2-3, and L3-4 with slightly milder narrowing at  L4-5. There is facet osteoarthritic change at L4-5 on the right at L5-S1 bilaterally.  IMPRESSION: Scoliosis. Multilevel osteoarthritic change. Prior fracture at T11, status post kyphoplasty. No acute fracture or spondylolisthesis.   Electronically Signed   By: Lowella Grip III M.D.   On: 10/29/2014 10:32   Dg Pelvis 1-2 Views  10/29/2014   CLINICAL DATA:  Fall this morning with low back pain and right leg weakness.  EXAM: PELVIS - 1-2 VIEW  COMPARISON:  09/01/2014  FINDINGS: There is no evidence of pelvic fracture or diastasis. No pelvic bone lesions are seen. No evidence hip fracture dislocation.  Pars defect on the right at L5 with advanced facet arthropathy.  IMPRESSION: 1. No acute findings. 2. If  unilateral hip pain, recommend dedicated imaging.   Electronically Signed   By: Monte Fantasia M.D.   On: 10/29/2014 10:33   Mr Brain Wo Contrast  10/16/2014   CLINICAL DATA:  Worsening memory loss. Dizziness, confusion Ing gait disturbance. Technologist unable to achieve venous access.  EXAM: MRI HEAD WITHOUT CONTRAST  TECHNIQUE: Multiplanar, multiecho pulse sequences of the brain and surrounding structures were obtained without intravenous contrast.  COMPARISON:  Head CT 09/01/2014  FINDINGS: Diffusion imaging does not show any acute or subacute infarction. The brainstem and cerebellum are normal. The cerebral hemispheres show age related atrophy with mild chronic small-vessel change of the white matter, not advanced for age. No cortical or large vessel territory infarction. No mass lesion, hemorrhage, hydrocephalus or extra-axial collection. No pituitary mass. No fluid in the sinuses, middle ears or mastoids.  IMPRESSION: No acute or reversible finding. Ordinary mild age related volume loss and mild small vessel change of the hemispheric white matter.   Electronically Signed   By: Nelson Chimes M.D.   On: 10/16/2014 13:26    Microbiology: No results found for this or any previous visit (from the past  240 hour(s)).   Labs: Basic Metabolic Panel:  Recent Labs Lab 10/29/14 1002 10/29/14 1445 10/30/14 0300  NA 147* 148* 145  K 2.1* 2.5* 3.8  CL 112* 119* 118*  CO2 22 21* 19*  GLUCOSE 117* 83 79  BUN 14 13 8   CREATININE 1.02* 0.81 0.71  CALCIUM 9.5 8.0* 8.2*   Liver Function Tests: No results for input(s): AST, ALT, ALKPHOS, BILITOT, PROT, ALBUMIN in the last 168 hours. No results for input(s): LIPASE, AMYLASE in the last 168 hours. No results for input(s): AMMONIA in the last 168 hours. CBC:  Recent Labs Lab 10/29/14 0949  WBC 6.9  NEUTROABS 1.9  HGB 13.6  HCT 38.6  MCV 90.0  PLT 218   Cardiac Enzymes:  Recent Labs Lab 10/29/14 1002  CKTOTAL 68   BNP: BNP (last 3 results) No results for input(s): BNP in the last 8760 hours.  ProBNP (last 3 results) No results for input(s): PROBNP in the last 8760 hours.  CBG: No results for input(s): GLUCAP in the last 168 hours.     SignedNita Sells  [facility] 10/30/2014, 5:41 PM

## 2014-10-30 NOTE — Progress Notes (Signed)
Utilization review completed.  

## 2014-10-30 NOTE — Evaluation (Signed)
Physical Therapy Evaluation Patient Details Name: Ann Gonzales MRN: [id number] DOB: [birth year] Today's Date: 10/30/2014   History of Present Illness  74 yo female with onset of R thigh pain after a fall now progressed to B thighs but centered on R medial knee.  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  Her medial R knee was the source of pain with flexion and wbing being the triggers.  Has not been assessed on R knee completely medically and will pass along to MD as did with nursing.  PT to focus on strengthening R knee and trying to restore gait tolerance.    Follow Up Recommendations SNF    Equipment Recommendations  None recommended by PT    Recommendations for Other Services       Precautions / Restrictions Precautions Precautions: Fall;Other (comment) (Telemetry) Precaution Comments: back due to changes Restrictions Weight Bearing Restrictions: No      Mobility  Bed Mobility Overal bed mobility: Needs Assistance Bed Mobility: Supine to Sit;Sit to Supine     Supine to sit: Min assist Sit to supine: Min assist   General bed mobility comments: using assist from PT to lift trunk and to move hips to side of bed  Transfers Overall transfer level: Needs assistance Equipment used: 1 person hand held assist;Rolling walker (2 wheeled) Transfers: Sit to/from Stand Sit to Stand: Mod assist;From elevated surface (min to maintain)         General transfer comment: reminders for sequence and safety  Ambulation/Gait Ambulation/Gait assistance: Mod assist;Min assist Ambulation Distance (Feet): 5 Feet Assistive device: Rolling walker (2 wheeled);1 person hand held assist Gait Pattern/deviations: Step-to pattern;Wide base of support;Decreased stance time -  right Gait velocity: reduced Gait velocity interpretation: Below normal speed for age/gender General Gait Details: sidesteps bedside with R knee tending to buckle  Stairs            Wheelchair Mobility    Modified Rankin (Stroke Patients Only)       Balance Overall balance assessment: Needs assistance Sitting-balance support: Feet supported Sitting balance-Leahy Scale: Fair   Postural control: Posterior lean Standing balance support: Bilateral upper extremity supported Standing balance-Leahy Scale: Poor                               Pertinent Vitals/Pain Pain Assessment: Faces Pain Score: 4  Faces Pain Scale: Hurts little more Pain Location: R knee Pain Intervention(s): Limited activity within patient's tolerance;Monitored during session    [place] expects to be discharged to:: Private residence Living Arrangements: Spouse/significant other Available Help at Discharge: Family;Available PRN/intermittently Type of Home: House Home Access: Level entry     Home Layout: One level Home Equipment: Walker - 2 wheels;Shower seat      Prior Function Level of Independence: Independent         Comments: Pt reports independence with ADLs and no use of DME     Hand Dominance        Extremity/Trunk Assessment   Upper Extremity Assessment: Overall WFL for tasks assessed           Lower Extremity Assessment: Generalized weakness      Cervical / Trunk Assessment: Kyphotic  Communication  Communication: No difficulties  Cognition Arousal/Alertness: Awake/alert Behavior During Therapy: WFL for tasks assessed/performed Overall Cognitive Status: Within Functional Limits for tasks assessed                      General Comments General comments (skin integrity, edema, etc.): Pt is having some diffculty controlling RLE and centralized pain to R knee, specifically when flexing knee.  Her pain reduced when the R knee is  extended.  Reported to nursing and left MD a note.    Exercises        Assessment/Plan    PT Assessment Patient needs continued PT services  PT Diagnosis Acute pain;Difficulty walking   PT Problem List Decreased strength;Decreased range of motion;Decreased activity tolerance;Decreased balance;Decreased mobility;Decreased coordination;Decreased knowledge of use of DME;Cardiopulmonary status limiting activity;Pain  PT Treatment Interventions DME instruction;Gait training;Stair training;Functional mobility training;Therapeutic activities;Therapeutic exercise;Balance training;Neuromuscular re-education;Patient/family education   PT Goals (Current goals can be found in the Care Plan section) Acute Rehab PT Goals Patient Stated Goal: to stand on R leg without pain PT Goal Formulation: With patient Time For Goal Achievement: 11/13/14 Potential to Achieve Goals: Good    Frequency Min 2X/week   Barriers to discharge Inaccessible home environment;Decreased caregiver support      Co-evaluation               End of Session   Activity Tolerance: Patient tolerated treatment well;Patient limited by pain;Other (comment) (flexing R knee increases pain, extending relieve) Patient left: in bed;with call bell/phone within reach;with family/visitor present Nurse Communication: Mobility status;Other (comment) (knee pain)         Time: 3545-6256 PT Time Calculation (min) (ACUTE ONLY): 30 min   Charges:   PT Evaluation $Initial PT Evaluation Tier I: 1 Procedure PT Treatments $Therapeutic Activity: 8-22 mins   PT G Codes:        Ramond Dial 11/08/2014, 2:30 PM   Mee Hives, PT MS Acute Rehab Dept. Number: [facility] [id number] and [place] [phone]

## 2014-11-04 ENCOUNTER — Institutional Professional Consult (permissible substitution): Payer: Medicare Other | Admitting: Pulmonary Disease

## 2014-11-08 ENCOUNTER — Other Ambulatory Visit: Payer: Self-pay | Admitting: Family Medicine

## 2014-11-15 ENCOUNTER — Encounter: Payer: Medicare Other | Admitting: Gastroenterology

## 2014-11-15 ENCOUNTER — Other Ambulatory Visit: Payer: Self-pay | Admitting: Family Medicine

## 2014-11-15 MED ORDER — LEVOTHYROXINE SODIUM 25 MCG PO TABS: 25.0000 ug | ORAL_TABLET | Freq: Every day | ORAL | Status: DC

## 2014-12-10 ENCOUNTER — Ambulatory Visit (INDEPENDENT_AMBULATORY_CARE_PROVIDER_SITE_OTHER): Payer: Medicare Other | Admitting: Neurology

## 2014-12-10 ENCOUNTER — Encounter: Payer: Self-pay | Admitting: Neurology

## 2014-12-10 VITALS — BP 100/64 | HR 53 | Resp 14 | Wt 95.0 lb

## 2014-12-10 DIAGNOSIS — F329 Major depressive disorder, single episode, unspecified: Secondary | ICD-10-CM

## 2014-12-10 DIAGNOSIS — G3184 Mild cognitive impairment, so stated: Secondary | ICD-10-CM | POA: Insufficient documentation

## 2014-12-10 DIAGNOSIS — E079 Disorder of thyroid, unspecified: Secondary | ICD-10-CM | POA: Diagnosis not present

## 2014-12-10 DIAGNOSIS — F32A Depression, unspecified: Secondary | ICD-10-CM

## 2014-12-10 MED ORDER — DONEPEZIL HCL 10 MG PO TABS: ORAL_TABLET | ORAL | Status: DC

## 2014-12-10 NOTE — Progress Notes (Addendum)
NEUROLOGY CONSULTATION NOTE  Nicoletta Hush MRN: [id number] DOB: 09/07/1940  Referring provider: Dr. Garnet Koyanagi Primary care provider: Dr. Garnet Koyanagi  Reason for consult:  Memory loss  Dear Dr  Etter Sjogren:  Thank you for your kind referral of Mikaela Hilgeman for consultation of the above symptoms. Although her history is well known to you, please allow me to reiterate it for the purpose of our medical record. The patient was accompanied to the clinic by her son and daughter-in-law who also provide collateral information. Records and images were personally reviewed where available.  HISTORY OF PRESENT ILLNESS: This is a pleasant 74 year old right-handed woman with a history of migraines, rheumatoid arthritis, hypothyroidism, chronic pain, presenting for second opinion on memory loss. She feels her memory is "sometimes good, sometimes bad." Her family started noticing changes a couple of years ago, she would repeat herself and start the same stories. She stopped driving 2 months ago after her family expressed concerns about her getting lost and slower reaction time (was not getting lost). Her daughter-in-law took over bill payments 2 years ago to make things more simple. Her husband puts out her medications for her, they are not sure if he is doing a good job handling this, but she reports that she is pretty good with taking them. She occasionally misplaces things. She starts projects but cannot finish them. Her family reports personality changes, she is "not as perky," "a zombie," she looks at you and is "just not there." She reports her mood is "lousy."   She was admitted to [facility] in April for failure to thrive. She apparently stopped eating a few weeks prior and reported food was not appealing anymore. She saw her PCP in May, MMSE 27/30. She was started on Aricept 5mg  daily. She had been seeing Dr. Berdine Addison in [facility] for her back pain, and saw him for the poor appetite and memory loss. Aricept dose  was increased to 10mg  and she was started on Zoloft. Her family is unsure if she had been taking an antidepressant in the past. She has been having occasional nightmares since starting Aricept, and has noticed loose stools. She does have a diagnosis of IBS in the past. She continues to have poor appetite. She reports sleep is pretty good. She has occasional dizziness described as a rocking sensation when walking. She has chronic back pain and occasional numbness in both hands. No diplopia, dysarthria, dysphagia, rare headaches, no anosmia or tremors. She denies any significant head injuries, alcohol intake, or family history of dementia.   I personally reviewed MRI brain without contrast which did not show any acute changes. There was mild diffuse atrophy and chronic microvascular disease.  Laboratory Data: Lab Results  Component Value Date   WBC 6.9 10/29/2014   HGB 13.6 10/29/2014   HCT 38.6 10/29/2014   MCV 90.0 10/29/2014   PLT 218 10/29/2014     Chemistry      Component Value Date/Time   NA 145 10/30/2014 0300   K 3.8 10/30/2014 0300   CL 118* 10/30/2014 0300   CO2 19* 10/30/2014 0300   BUN 8 10/30/2014 0300   CREATININE 0.71 10/30/2014 0300      Component Value Date/Time   CALCIUM 8.2* 10/30/2014 0300   ALKPHOS 38* 09/01/2014 0450   AST 25 09/01/2014 0450   ALT 20 09/01/2014 0450   BILITOT 1.1 09/01/2014 0450     Lab Results  Component Value Date   TSH 0.026* 10/12/2014  Lab Results  Component Value Date   [id number] 1945* 09/01/2014    PAST MEDICAL HISTORY: Past Medical History  Diagnosis Date  . Hypothyroidism   . Hypertension   . Osteoarthritis   . Colitis   . Rheumatoid arthritis(714.0)   . GERD (gastroesophageal reflux disease)   . Anxiety   . History of stomach ulcers   . Migraine     "sometimes 2-3 times/month" (09/02/2014)  . Stroke     "I think I had a mild stroke years ago" (09/02/2014)  . Fibromyalgia   . Chronic lower back pain   . Kidney  stones   . Squamous cell carcinoma of nose   . Vocal cord paralysis   . Helicobacter pylori (H. pylori) infection     PAST SURGICAL HISTORY: Past Surgical History  Procedure Laterality Date  . Tonsillectomy    . Abdominal hysterectomy    . Cesarean section  1960's X 1  . Laparoscopic cholecystectomy    . Excisional hemorrhoidectomy    . Lumbar disc surgery  X 2    "lower back OR; don't know what they did"  . Cataract extraction w/ intraocular lens  implant, bilateral Bilateral   . Lithotripsy    . Squamous cell carcinoma excision      "nose"  . Kyphoplasty      Dr.Brown    MEDICATIONS: Current Outpatient Prescriptions on File Prior to Visit  Medication Sig Dispense Refill  . acetaminophen (TYLENOL) 500 MG tablet Take 1,000 mg by mouth every 6 (six) hours as needed for mild pain.    . cholecalciferol (VITAMIN D) 1000 UNITS tablet Take 1,000 Units by mouth daily.     . [facility] B-1 100 MG tablet Take 100 mg by mouth daily.  0  . diazepam (VALIUM) 5 MG tablet Take 5 mg by mouth. 1/2-1 tablet twice a day    . donepezil (ARICEPT) 5 MG tablet Take 1 tablet (5 mg total) by mouth at bedtime. (Patient taking differently: Take 5 mg by mouth. Take 1 tablet twice daily) 30 tablet 5  . etanercept (ENBREL) 50 MG/ML injection Inject 50 mg into the skin once a week.    . folic acid (FOLVITE) 1 MG tablet Take 1 tablet (1 mg total) by mouth daily. 30 tablet 3  . hydroxychloroquine (PLAQUENIL) 200 MG tablet TAKE 1 TABLET (200 MG TOTAL) BY MOUTH DAILY. 30 tablet 3  . levothyroxine (SYNTHROID, LEVOTHROID) 25 MCG tablet Take 1 tablet (25 mcg total) by mouth daily. 30 tablet 0  . megestrol (MEGACE) 40 MG tablet Take 1 tablet (40 mg total) by mouth daily. 30 tablet 3  . metoprolol (TOPROL-XL) 200 MG 24 hr tablet Take 0.5 tablets (100 mg total) by mouth daily. (Patient taking differently: Take 200 mg by mouth daily. ) 30 tablet 5  . mirtazapine (REMERON) 15 MG tablet Take 1 tablet (15 mg total) by mouth at  bedtime. 30 tablet 5  . Nutritional Supplements (BOOST BREEZE) LIQD 530 CAL / BOTTLE--DRINK 3 A DAY BETWEEN MEALS AND BETWEEN DINNER AND BEDTIME 6399 mL 3  . pantoprazole (PROTONIX) 40 MG tablet Take 40 mg by mouth daily.    Marland Kitchen oxyCODONE-acetaminophen (PERCOCET) 7.5-325 MG per tablet Take 1 tablet by mouth every 4 (four) hours as needed for moderate pain.      No current facility-administered medications on file prior to visit.    ALLERGIES: Allergies  Allergen Reactions  . Ciprofloxacin Rash    Doesn't remember   . Leflunomide Diarrhea and  Rash  . Methotrexate Rash    Mouth ulcers  . Nitrofurantoin Rash  . Penicillins Rash  . Salonpas Itching and Rash  . Sulfa Antibiotics Rash and Anxiety  . Tetanus Toxoid Adsorbed Itching  . Tetanus Toxoids Rash, Other (See Comments) and Itching    FAMILY HISTORY: Family History  Problem Relation Age of Onset  . Coronary artery disease Mother   . Lung cancer Father   . Heart disease Maternal Grandmother   . Heart disease Paternal Grandmother   . Colon cancer Neg Hx     SOCIAL HISTORY: History   Social History  . Marital Status: Married    Spouse Name: N/A  . Number of Children: 1  . Years of Education: N/A   Occupational History  . retired    Social History Main Topics  . Smoking status: Never Smoker   . Smokeless tobacco: Never Used  . Alcohol Use: No  . Drug Use: No  . Sexual Activity: Yes   Other Topics Concern  . Not on file   Social History Narrative    REVIEW OF SYSTEMS: Constitutional: No fevers, chills, or sweats, no generalized fatigue, +change in appetite Eyes: No visual changes, double vision, eye pain Ear, nose and throat: No hearing loss, ear pain, nasal congestion, sore throat Cardiovascular: No chest pain, palpitations Respiratory:  No shortness of breath at rest or with exertion, wheezes GastrointestinaI: No nausea, vomiting, diarrhea, abdominal pain, fecal incontinence Genitourinary:  No dysuria,  urinary retention or frequency Musculoskeletal:  No neck pain,+ back pain Integumentary: No rash, pruritus, skin lesions Neurological: as above Psychiatric: + depression,no insomnia, anxiety Endocrine: No palpitations, fatigue, diaphoresis, mood swings, change in appetite, change in weight, increased thirst Hematologic/Lymphatic:  No anemia, purpura, petechiae. Allergic/Immunologic: no itchy/runny eyes, nasal congestion, recent allergic reactions, rashes  PHYSICAL EXAM: Filed Vitals:   12/10/14 0857  BP: 100/64  Pulse: 53  Resp: 14   General: No acute distress Head:  Normocephalic/atraumatic Eyes: Fundoscopic exam shows bilateral sharp discs, no vessel changes, exudates, or hemorrhages Neck: supple, no paraspinal tenderness, full range of motion Back: No paraspinal tenderness Heart: regular rate and rhythm Lungs: Clear to auscultation bilaterally. Vascular: No carotid bruits. Skin/Extremities: No rash, no edema Neurological Exam: Mental status: alert and oriented to person, place, and time, no dysarthria or aphasia, Fund of knowledge is appropriate.  Recent and remote memory are intact.  Attention and concentration are normal.    Able to name objects and repeat phrases.  MMSE - Mini Mental State Exam 12/10/2014  Orientation to time 4  Orientation to Place 5  Registration 3  Attention/ Calculation 5  Recall 3  Language- name 2 objects 2  Language- repeat 1  Language- follow 3 step command 3  Language- read & follow direction 1  Write a sentence 1  Copy design 1  Total score 29   Cranial nerves: CN I: not tested CN II: pupils equal, round and reactive to light, visual fields intact, fundi unremarkable. CN III, IV, VI:  full range of motion, no nystagmus, no ptosis CN V: facial sensation intact CN VII: upper and lower face symmetric CN VIII: hearing intact to finger rub CN IX, X: gag intact, uvula midline CN XI: sternocleidomastoid and trapezius muscles intact CN XII:  tongue midline Bulk & Tone: normal, no fasciculations. Motor: 5/5 throughout with no pronator drift. Sensation: decreased vibration to right ankle, otherwise intact to light touch, cold, pin, and joint position sense.  No extinction to double simultaneous  stimulation.  Romberg test negative Deep Tendon Reflexes: +2 throughout except for absent ankle jerks bilaterally, no ankle clonus Plantar responses: downgoing bilaterally Cerebellar: no incoordination on finger to nose, heel to shin. No dysdiadochokinesia Gait: narrow-based, unsteady, difficulty with tandem walk Tremor: none  IMPRESSION: This is a 74 year old right-handed woman with a history of migraines, rheumatoid arthritis, hypothyroidism, chronic pain, with worsening memory. MMSE today normal 29/30 (27/30 in PCP office last May 2016). We discussed different causes of memory loss. Her TSH was low, and dose has been adjusted, she is scheduled for repeat bloodwork soon. B12 normal. MRI brain did not show any acute changes. We discussed pseudodementia and effects of mood on memory, as well as in her case, likely the poor appetite. She reports mood is "lousy." She had a neuropsychological evaluation but did not complete it, results unavailable for review and will be requested by family to fax to our office. She is agreeable to seeing Behavioral Medicine for depression. We discussed continuation of Zoloft and Aricept 10mg  daily, expectations from the medication. If symptoms continue after adequate treatment of thyroid issues and depression, we will reschedule for another Neuropsychological evaluation. She will follow-up in 6 months and knows to call our office for any changes.   Thank you for allowing me to participate in the care of this patient. Please do not hesitate to call for any questions or concerns.   Ellouise Newer, M.D.  CC: Dr. Etter Sjogren   ADDENDUM: Neuropsychological evaluation by Dr. Ane Payment done 11/18/14 reviewed.    Summary: This patient, with a history of weight loss due to loss of appetite, as well as declining cognition, is currently functioning in the moderately impaired range of overall intellectual ability. Her verbal reasoning skills were mildly impaired while her visual reasoning skills were moderately impaired. However, all of these scores are much lower than those predicted by her academic history, suggesting global intellectual decline. She was severely impaired in her memory for both auditory verbal and visual information, both at immediate recall, as well as after a delay. Recognition cuing or reminding enhanced her recall only slightly. She was significantly impaired in her visual spatial and constructional skills in both drawing and assembling. Expressive and receptive language skills were low average. She was severely impaired on a very simple test of executive functions. Affective disorder, such as anxiety or depression, does not appear to be accounting for her current difficulties. It is my opinion that, given the extent of her deficits across multiple domains, her poor scores would not appear to be attributable to poor nutrition alone. This pattern is more consistent with an early dementia.  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  Recommendations: 1. It is recommended that the patient continue with cholinergic therapy for her dementia.  2. Given the patient's deficits in sustained attention, memory, visual reasoning skills, and executive functions, it is recommended that she continue to refrain from driving.

## 2014-12-10 NOTE — Patient Instructions (Addendum)
1. Continue Aricept 10mg  daily 2. Refer to Behavioral Medicine for psychiatry and psychotherapy 3. Please ask Neuropsychological evaluation to be faxed to [place], thanks 3. Monitor thyroid function 4. Physical exercise, brain stimulation exercises, and control of BP, cholesterol, are important for brain health 5. Follow-up in 6 months

## 2014-12-16 ENCOUNTER — Ambulatory Visit (INDEPENDENT_AMBULATORY_CARE_PROVIDER_SITE_OTHER): Payer: Medicare Other | Admitting: Family Medicine

## 2014-12-16 ENCOUNTER — Encounter: Payer: Self-pay | Admitting: Family Medicine

## 2014-12-16 VITALS — BP 102/66 | HR 55 | Temp 97.7°F | Ht 59.0 in | Wt 92.8 lb

## 2014-12-16 DIAGNOSIS — E876 Hypokalemia: Secondary | ICD-10-CM

## 2014-12-16 DIAGNOSIS — K589 Irritable bowel syndrome without diarrhea: Secondary | ICD-10-CM

## 2014-12-16 DIAGNOSIS — I959 Hypotension, unspecified: Secondary | ICD-10-CM | POA: Insufficient documentation

## 2014-12-16 DIAGNOSIS — E038 Other specified hypothyroidism: Secondary | ICD-10-CM

## 2014-12-16 DIAGNOSIS — I9589 Other hypotension: Secondary | ICD-10-CM

## 2014-12-16 MED ORDER — HYOSCYAMINE SULFATE ER 0.375 MG PO TB12: 0.3750 mg | ORAL_TABLET | Freq: Two times a day (BID) | ORAL | Status: AC

## 2014-12-16 MED ORDER — METOPROLOL SUCCINATE ER 100 MG PO TB24: 100.0000 mg | ORAL_TABLET | Freq: Every day | ORAL | Status: DC

## 2014-12-16 NOTE — Assessment & Plan Note (Signed)
Check labs today.

## 2014-12-16 NOTE — Patient Instructions (Signed)

## 2014-12-16 NOTE — Progress Notes (Signed)
Patient ID: Ann Gonzales, female   DOB: [birth year], 74 y.o.   MRN: [id number]   Subjective:    Patient ID: Ann Gonzales, female    DOB: 1940-07-20, 74 y.o.   MRN: [id number]  Chief Complaint  Patient presents with  . Bradycardia    wants to discuss Metoprolol and changes to Med's    HPI Patient is in today for low  bp and pulse---- neuro was concerned and sent her here to be checked.  Her son and daughter in law are here with her.  She has been taking double dose of BB.  She was supposed to have cut them in half and did not. Pt was dx with alz dementia and is an aricept but we are waiting to see TSH before anything else is changed.   They also c/o anything she eats going right through her.  She was dx with IBS and needed colonscopy but pt refused at the time because they were told previously her colon was "thin".  She was given a med then that was qid but she could not remember to take it.  She is not eating and has lost significant amount of weight.    Past Medical History  Diagnosis Date  . Hypothyroidism   . Hypertension   . Osteoarthritis   . Colitis   . Rheumatoid arthritis(714.0)   . GERD (gastroesophageal reflux disease)   . Anxiety   . History of stomach ulcers   . Migraine     "sometimes 2-3 times/month" (09/02/2014)  . Stroke     "I think I had a mild stroke years ago" (09/02/2014)  . Fibromyalgia   . Chronic lower back pain   . Kidney stones   . Squamous cell carcinoma of nose   . Vocal cord paralysis   . Helicobacter pylori (H. pylori) infection     Past Surgical History  Procedure Laterality Date  . Tonsillectomy    . Abdominal hysterectomy    . Cesarean section  1960's X 1  . Laparoscopic cholecystectomy    . Excisional hemorrhoidectomy    . Lumbar disc surgery  X 2    "lower back OR; don't know what they did"  . Cataract extraction w/ intraocular lens  implant, bilateral Bilateral   . Lithotripsy    . Squamous cell carcinoma excision      "nose"  .  Kyphoplasty      Dr.Brown    Family History  Problem Relation Age of Onset  . Coronary artery disease Mother   . Lung cancer Father   . Heart disease Maternal Grandmother   . Heart disease Paternal Grandmother   . Colon cancer Neg Hx     Social History   Social History  . Marital Status: Married    Spouse Name: N/A  . Number of Children: 1  . Years of Education: N/A   Occupational History  . retired    Social History Main Topics  . Smoking status: Never Smoker   . Smokeless tobacco: Never Used  . Alcohol Use: No  . Drug Use: No  . Sexual Activity: Yes   Other Topics Concern  . Not on file   Social History Narrative    Outpatient Prescriptions Prior to Visit  Medication Sig Dispense Refill  . acetaminophen (TYLENOL) 500 MG tablet Take 1,000 mg by mouth every 6 (six) hours as needed for mild pain.    . B Complex-C (B-COMPLEX WITH VITAMIN C) tablet Take 1 tablet by mouth  daily.    . cephALEXin (KEFLEX) 250 MG capsule Take 250 mg by mouth daily.    . cholecalciferol (VITAMIN D) 1000 UNITS tablet Take 1,000 Units by mouth daily.     . [facility] B-1 100 MG tablet Take 100 mg by mouth daily.  0  . diazepam (VALIUM) 5 MG tablet Take 5 mg by mouth. 1/2-1 tablet twice a day    . donepezil (ARICEPT) 10 MG tablet Take 1 tablet daily 90 tablet 3  . etanercept (ENBREL) 50 MG/ML injection Inject 50 mg into the skin once a week.    . folic acid (FOLVITE) 1 MG tablet Take 1 tablet (1 mg total) by mouth daily. 30 tablet 3  . hydroxychloroquine (PLAQUENIL) 200 MG tablet TAKE 1 TABLET (200 MG TOTAL) BY MOUTH DAILY. 30 tablet 3  . levothyroxine (SYNTHROID, LEVOTHROID) 25 MCG tablet Take 1 tablet (25 mcg total) by mouth daily. 30 tablet 0  . Nutritional Supplements (BOOST BREEZE) LIQD 530 CAL / BOTTLE--DRINK 3 A DAY BETWEEN MEALS AND BETWEEN DINNER AND BEDTIME 6399 mL 3  . pantoprazole (PROTONIX) 40 MG tablet Take 40 mg by mouth daily.    . sertraline (ZOLOFT) 100 MG tablet Take 100 mg by  mouth daily.    . metoprolol (TOPROL-XL) 200 MG 24 hr tablet Take 0.5 tablets (100 mg total) by mouth daily. (Patient taking differently: Take 200 mg by mouth daily. ) 30 tablet 5  . oxyCODONE-acetaminophen (PERCOCET) 7.5-325 MG per tablet Take 1 tablet by mouth every 4 (four) hours as needed for moderate pain.     . megestrol (MEGACE) 40 MG tablet Take 1 tablet (40 mg total) by mouth daily. 30 tablet 3  . mirtazapine (REMERON) 15 MG tablet Take 1 tablet (15 mg total) by mouth at bedtime. 30 tablet 5   No facility-administered medications prior to visit.    Allergies  Allergen Reactions  . Ciprofloxacin Rash    Doesn't remember   . Leflunomide Diarrhea and Rash  . Methotrexate Rash    Mouth ulcers  . Nitrofurantoin Rash  . Penicillins Rash  . Salonpas Itching and Rash  . Sulfa Antibiotics Rash and Anxiety  . Tetanus Toxoid Adsorbed Itching  . Tetanus Toxoids Rash, Other (See Comments) and Itching    Review of Systems  Constitutional: Positive for weight loss. Negative for fever and malaise/fatigue.  HENT: Negative for congestion.   Eyes: Negative for discharge.  Respiratory: Negative for shortness of breath.   Cardiovascular: Negative for chest pain, palpitations and leg swelling.  Gastrointestinal: Positive for diarrhea. Negative for nausea and abdominal pain.  Genitourinary: Negative for dysuria.  Musculoskeletal: Negative for falls.  Skin: Negative for rash.  Neurological: Negative for loss of consciousness and headaches.  Endo/Heme/Allergies: Negative for environmental allergies.  Psychiatric/Behavioral: Negative for depression. The patient is not nervous/anxious.        Objective:    Physical Exam  Constitutional: She appears well-developed and well-nourished.  HENT:  Head: Normocephalic and atraumatic.  Eyes: Conjunctivae and EOM are normal.  Neck: Normal range of motion. Neck supple. No JVD present. Carotid bruit is not present. No thyromegaly present.    Cardiovascular: Normal rate, regular rhythm and normal heart sounds.   No murmur heard. Pulmonary/Chest: Effort normal and breath sounds normal. No respiratory distress. She has no wheezes. She has no rales. She exhibits no tenderness.  Abdominal: Bowel sounds are normal. There is no tenderness.  Musculoskeletal: She exhibits no edema.  Neurological: She is alert.  Psychiatric: Her  behavior is normal. Thought content normal. Cognition and memory are impaired. She exhibits abnormal recent memory.    BP 102/66 mmHg  Pulse 55  Temp(Src) 97.7 F (36.5 C) (Oral)  Ht 4\' 11"  (1.499 m)  Wt 92 lb 12.8 oz (42.094 kg)  BMI 18.73 kg/m2  SpO2 97% Wt Readings from Last 3 Encounters:  12/16/14 92 lb 12.8 oz (42.094 kg)  12/10/14 95 lb (43.092 kg)  10/29/14 105 lb 2.6 oz (47.7 kg)     Lab Results  Component Value Date   WBC 6.9 10/29/2014   HGB 13.6 10/29/2014   HCT 38.6 10/29/2014   PLT 218 10/29/2014   GLUCOSE 79 10/30/2014   ALT 20 09/01/2014   AST 25 09/01/2014   NA 145 10/30/2014   K 3.8 10/30/2014   CL 118* 10/30/2014   CREATININE 0.71 10/30/2014   BUN 8 10/30/2014   CO2 19* 10/30/2014   TSH 0.026* 10/12/2014    Lab Results  Component Value Date   TSH 0.026* 10/12/2014   Lab Results  Component Value Date   WBC 6.9 10/29/2014   HGB 13.6 10/29/2014   HCT 38.6 10/29/2014   MCV 90.0 10/29/2014   PLT 218 10/29/2014   Lab Results  Component Value Date   NA 145 10/30/2014   K 3.8 10/30/2014   CO2 19* 10/30/2014   GLUCOSE 79 10/30/2014   BUN 8 10/30/2014   CREATININE 0.71 10/30/2014   BILITOT 1.1 09/01/2014   ALKPHOS 38* 09/01/2014   AST 25 09/01/2014   ALT 20 09/01/2014   PROT 7.5 09/01/2014   ALBUMIN 4.3 09/01/2014   CALCIUM 8.2* 10/30/2014   ANIONGAP 8 10/30/2014   GFR 65.02 09/14/2014   No results found for: CHOL No results found for: HDL No results found for: LDLCALC No results found for: TRIG No results found for: CHOLHDL No results found for:  HGBA1C     Assessment & Plan:   Problem List Items Addressed This Visit    Hypothyroidism - Primary   Relevant Medications   metoprolol succinate (TOPROL XL) 100 MG 24 hr tablet   Other Relevant Orders   TSH   Hypokalemia   Relevant Orders   Basic metabolic panel    Other Visit Diagnoses    IBS (irritable bowel syndrome)        Relevant Medications    hyoscyamine (LEVBID) 0.375 MG 12 hr tablet       I have discontinued Ms. Gittleman's oxyCODONE-acetaminophen, megestrol, mirtazapine, and metoprolol. I am also having her start on hyoscyamine and metoprolol succinate. Additionally, I am having her maintain her diazepam, etanercept, pantoprazole, cholecalciferol, folic acid, hydroxychloroquine, CVS B-1, acetaminophen, BOOST BREEZE, levothyroxine, cephALEXin, sertraline, B-complex with vitamin C, and donepezil.  Meds ordered this encounter  Medications  . hyoscyamine (LEVBID) 0.375 MG 12 hr tablet    Sig: Take 1 tablet (0.375 mg total) by mouth 2 (two) times daily.    Dispense:  60 tablet    Refill:  0  . metoprolol succinate (TOPROL XL) 100 MG 24 hr tablet    Sig: Take 1 tablet (100 mg total) by mouth daily. Take with or immediately following a meal.    Dispense:  30 tablet    Refill:  [place], DO

## 2014-12-16 NOTE — Assessment & Plan Note (Signed)
Dec metoprolol to 100 mg daily Recheck 2-3 weeks

## 2014-12-16 NOTE — Progress Notes (Signed)
Pre visit review using our clinic review tool, if applicable. No additional management support is needed unless otherwise documented below in the visit note. 

## 2014-12-16 NOTE — Assessment & Plan Note (Signed)
Recheck labs today. 

## 2014-12-17 LAB — BASIC METABOLIC PANEL
BUN: 16 mg/dL (ref 6–23)
CO2: 17 mEq/L — ABNORMAL LOW (ref 19–32)
Calcium: 9.5 mg/dL (ref 8.4–10.5)
Chloride: 112 mEq/L (ref 96–112)
Creatinine, Ser: 1.14 mg/dL (ref 0.40–1.20)
GFR: 49.46 mL/min — ABNORMAL LOW (ref >60.00)
Glucose, Bld: 81 mg/dL (ref 70–99)
POTASSIUM: 3.6 meq/L (ref 3.5–5.1)
SODIUM: 139 meq/L (ref 135–145)

## 2014-12-17 LAB — TSH: TSH: 96.47 u[IU]/mL — ABNORMAL HIGH (ref 0.35–4.50)

## 2014-12-20 ENCOUNTER — Other Ambulatory Visit: Payer: Medicare Other

## 2014-12-23 ENCOUNTER — Other Ambulatory Visit: Payer: Self-pay | Admitting: Family Medicine

## 2014-12-24 ENCOUNTER — Other Ambulatory Visit: Payer: Self-pay

## 2014-12-24 MED ORDER — LEVOTHYROXINE SODIUM 50 MCG PO TABS: ORAL_TABLET | ORAL | Status: DC

## 2014-12-24 MED ORDER — POTASSIUM CHLORIDE ER 10 MEQ PO TBCR: 10.0000 meq | EXTENDED_RELEASE_TABLET | Freq: Once | ORAL | Status: DC

## 2014-12-24 MED ORDER — LEVOTHYROXINE SODIUM 25 MCG PO TABS
ORAL_TABLET | ORAL | Status: DC
Start: 2014-12-24 — End: 2014-12-24

## 2014-12-27 ENCOUNTER — Ambulatory Visit (INDEPENDENT_AMBULATORY_CARE_PROVIDER_SITE_OTHER): Payer: Medicare Other | Admitting: Family Medicine

## 2014-12-27 ENCOUNTER — Inpatient Hospital Stay (HOSPITAL_BASED_OUTPATIENT_CLINIC_OR_DEPARTMENT_OTHER)
Admission: RE | Admit: 2014-12-27 | Discharge: 2014-12-31 | DRG: 643 | Disposition: A | Discharge status: Home / Self Care | Payer: Medicare Other | Attending: Internal Medicine | Admitting: Internal Medicine

## 2014-12-27 ENCOUNTER — Encounter: Payer: Self-pay | Admitting: Family Medicine

## 2014-12-27 ENCOUNTER — Encounter (HOSPITAL_BASED_OUTPATIENT_CLINIC_OR_DEPARTMENT_OTHER): Payer: Self-pay

## 2014-12-27 ENCOUNTER — Emergency Department (HOSPITAL_BASED_OUTPATIENT_CLINIC_OR_DEPARTMENT_OTHER): Payer: Medicare Other

## 2014-12-27 VITALS — BP 98/68 | HR 69 | Temp 98.0°F | Wt 89.4 lb

## 2014-12-27 DIAGNOSIS — N3000 Acute cystitis without hematuria: Secondary | ICD-10-CM | POA: Diagnosis present

## 2014-12-27 DIAGNOSIS — N289 Disorder of kidney and ureter, unspecified: Secondary | ICD-10-CM | POA: Diagnosis present

## 2014-12-27 DIAGNOSIS — K219 Gastro-esophageal reflux disease without esophagitis: Secondary | ICD-10-CM | POA: Diagnosis present

## 2014-12-27 DIAGNOSIS — E039 Hypothyroidism, unspecified: Secondary | ICD-10-CM | POA: Diagnosis not present

## 2014-12-27 DIAGNOSIS — M545 Low back pain: Secondary | ICD-10-CM | POA: Diagnosis present

## 2014-12-27 DIAGNOSIS — E43 Unspecified severe protein-calorie malnutrition: Secondary | ICD-10-CM | POA: Insufficient documentation

## 2014-12-27 DIAGNOSIS — J3800 Paralysis of vocal cords and larynx, unspecified: Secondary | ICD-10-CM | POA: Diagnosis present

## 2014-12-27 DIAGNOSIS — Z880 Allergy status to penicillin: Secondary | ICD-10-CM

## 2014-12-27 DIAGNOSIS — Z681 Body mass index (BMI) 19 or less, adult: Secondary | ICD-10-CM

## 2014-12-27 DIAGNOSIS — M069 Rheumatoid arthritis, unspecified: Secondary | ICD-10-CM | POA: Diagnosis present

## 2014-12-27 DIAGNOSIS — R110 Nausea: Secondary | ICD-10-CM

## 2014-12-27 DIAGNOSIS — R001 Bradycardia, unspecified: Secondary | ICD-10-CM | POA: Diagnosis not present

## 2014-12-27 DIAGNOSIS — R627 Adult failure to thrive: Secondary | ICD-10-CM | POA: Diagnosis present

## 2014-12-27 DIAGNOSIS — Z8673 Personal history of transient ischemic attack (TIA), and cerebral infarction without residual deficits: Secondary | ICD-10-CM

## 2014-12-27 DIAGNOSIS — W1809XA Striking against other object with subsequent fall, initial encounter: Secondary | ICD-10-CM

## 2014-12-27 DIAGNOSIS — E063 Autoimmune thyroiditis: Secondary | ICD-10-CM | POA: Diagnosis not present

## 2014-12-27 DIAGNOSIS — E860 Dehydration: Secondary | ICD-10-CM | POA: Diagnosis present

## 2014-12-27 DIAGNOSIS — E876 Hypokalemia: Secondary | ICD-10-CM

## 2014-12-27 DIAGNOSIS — F32A Depression, unspecified: Secondary | ICD-10-CM | POA: Diagnosis present

## 2014-12-27 DIAGNOSIS — Z85828 Personal history of other malignant neoplasm of skin: Secondary | ICD-10-CM

## 2014-12-27 DIAGNOSIS — K529 Noninfective gastroenteritis and colitis, unspecified: Secondary | ICD-10-CM | POA: Diagnosis present

## 2014-12-27 DIAGNOSIS — F329 Major depressive disorder, single episode, unspecified: Secondary | ICD-10-CM | POA: Diagnosis present

## 2014-12-27 DIAGNOSIS — R008 Other abnormalities of heart beat: Secondary | ICD-10-CM | POA: Diagnosis present

## 2014-12-27 DIAGNOSIS — Z8711 Personal history of peptic ulcer disease: Secondary | ICD-10-CM

## 2014-12-27 DIAGNOSIS — N390 Urinary tract infection, site not specified: Secondary | ICD-10-CM | POA: Diagnosis present

## 2014-12-27 DIAGNOSIS — I10 Essential (primary) hypertension: Secondary | ICD-10-CM | POA: Diagnosis present

## 2014-12-27 DIAGNOSIS — Z79899 Other long term (current) drug therapy: Secondary | ICD-10-CM

## 2014-12-27 DIAGNOSIS — G8929 Other chronic pain: Secondary | ICD-10-CM | POA: Diagnosis present

## 2014-12-27 DIAGNOSIS — R42 Dizziness and giddiness: Secondary | ICD-10-CM

## 2014-12-27 DIAGNOSIS — M797 Fibromyalgia: Secondary | ICD-10-CM | POA: Diagnosis present

## 2014-12-27 LAB — CBC WITH DIFFERENTIAL/PLATELET
BASOS PCT: 1 % (ref 0–1)
Basophils Absolute: 0.1 10*3/uL (ref 0.0–0.1)
EOS ABS: 0.2 10*3/uL (ref 0.0–0.7)
EOS PCT: 3 % (ref 0–5)
HEMATOCRIT: 36.8 % (ref 36.0–46.0)
Hemoglobin: 13.1 g/dL (ref 12.0–15.0)
Lymphocytes Relative: 46 % (ref 12–46)
Lymphs Abs: 3.8 10*3/uL (ref 0.7–4.0)
MCH: 31.8 pg (ref 26.0–34.0)
MCHC: 35.6 g/dL (ref 30.0–36.0)
MCV: 89.3 fL (ref 78.0–100.0)
MONOS PCT: 13 % — AB (ref 3–12)
Monocytes Absolute: 1 10*3/uL (ref 0.1–1.0)
Neutro Abs: 3 10*3/uL (ref 1.7–7.7)
Neutrophils Relative %: 37 % — ABNORMAL LOW (ref 43–77)
Platelets: 234 10*3/uL (ref 150–400)
RBC: 4.12 MIL/uL (ref 3.87–5.11)
RDW: 15.6 % — AB (ref 11.5–15.5)
WBC: 8.1 10*3/uL (ref 4.0–10.5)

## 2014-12-27 LAB — COMPREHENSIVE METABOLIC PANEL
ALBUMIN: 4.2 g/dL (ref 3.5–5.0)
ALK PHOS: 40 U/L (ref 38–126)
ALT: 49 U/L (ref 14–54)
AST: 56 U/L — ABNORMAL HIGH (ref 15–41)
Anion gap: 10 (ref 5–15)
BUN: 18 mg/dL (ref 6–20)
CALCIUM: 9.3 mg/dL (ref 8.9–10.3)
CO2: 19 mmol/L — ABNORMAL LOW (ref 22–32)
CREATININE: 1.23 mg/dL — AB (ref 0.44–1.00)
Chloride: 112 mmol/L — ABNORMAL HIGH (ref 101–111)
GFR calc Af Amer: 49 mL/min — ABNORMAL LOW (ref >60)
GFR calc non Af Amer: 42 mL/min — ABNORMAL LOW (ref >60)
GLUCOSE: 89 mg/dL (ref 65–99)
Potassium: 3.3 mmol/L — ABNORMAL LOW (ref 3.5–5.1)
Sodium: 141 mmol/L (ref 135–145)
Total Bilirubin: 0.8 mg/dL (ref 0.3–1.2)
Total Protein: 7.8 g/dL (ref 6.5–8.1)

## 2014-12-27 LAB — URINALYSIS, ROUTINE W REFLEX MICROSCOPIC
Bilirubin Urine: NEGATIVE
GLUCOSE, UA: NEGATIVE mg/dL
HGB URINE DIPSTICK: NEGATIVE
Ketones, ur: NEGATIVE mg/dL
Nitrite: POSITIVE — AB
Protein, ur: 100 mg/dL — AB
Specific Gravity, Urine: 1.027 (ref 1.005–1.030)
Urobilinogen, UA: 0.2 mg/dL (ref 0.0–1.0)
pH: 6 (ref 5.0–8.0)

## 2014-12-27 LAB — TROPONIN I

## 2014-12-27 LAB — URINE MICROSCOPIC-ADD ON

## 2014-12-27 LAB — MAGNESIUM: MAGNESIUM: 2.2 mg/dL (ref 1.7–2.4)

## 2014-12-27 LAB — I-STAT CG4 LACTIC ACID, ED: Lactic Acid, Venous: 0.98 mmol/L (ref 0.5–2.0)

## 2014-12-27 LAB — TSH: TSH: >90 u[IU]/mL — ABNORMAL HIGH (ref 0.350–4.500)

## 2014-12-27 MED ORDER — ENOXAPARIN SODIUM 30 MG/0.3ML ~~LOC~~ SOLN
20.0000 mg | SUBCUTANEOUS | Status: DC
  Administered 2014-12-28 – 2014-12-31 (×4): 20 mg via SUBCUTANEOUS
  Filled 2014-12-27: qty 0.2
  Filled 2014-12-27 (×2): qty 0.3
  Filled 2014-12-27 (×2): qty 0.2
  Filled 2014-12-27: qty 0.3
  Filled 2014-12-27 (×2): qty 0.2

## 2014-12-27 MED ORDER — HYDROXYCHLOROQUINE SULFATE 200 MG PO TABS
200.0000 mg | ORAL_TABLET | Freq: Every day | ORAL | Status: DC
Start: 2014-12-28 — End: 2014-12-31
  Administered 2014-12-28 – 2014-12-31 (×4): 200 mg via ORAL
  Filled 2014-12-27 (×4): qty 1

## 2014-12-27 MED ORDER — DEXTROSE 5 % IV SOLN
1.0000 g | Freq: Once | INTRAVENOUS | Status: AC
  Administered 2014-12-27: 1 g via INTRAVENOUS

## 2014-12-27 MED ORDER — DONEPEZIL HCL 10 MG PO TABS: 10.0000 mg | ORAL_TABLET | Freq: Every day | ORAL | Status: DC

## 2014-12-27 MED ORDER — SODIUM CHLORIDE 0.9 % IV BOLUS (SEPSIS)
500.0000 mL | Freq: Once | INTRAVENOUS | Status: AC
  Administered 2014-12-27: 500 mL via INTRAVENOUS

## 2014-12-27 MED ORDER — ETANERCEPT 50 MG/ML ~~LOC~~ SOLN: 50.0000 mg | SUBCUTANEOUS | Status: DC

## 2014-12-27 MED ORDER — FOLIC ACID 1 MG PO TABS
1.0000 mg | ORAL_TABLET | Freq: Every day | ORAL | Status: DC
  Administered 2014-12-28 – 2014-12-31 (×4): 1 mg via ORAL
  Filled 2014-12-27 (×4): qty 1

## 2014-12-27 MED ORDER — HYOSCYAMINE SULFATE ER 0.375 MG PO TB12
0.3750 mg | ORAL_TABLET | Freq: Two times a day (BID) | ORAL | Status: DC
  Administered 2014-12-27 – 2014-12-31 (×8): 0.375 mg via ORAL
  Filled 2014-12-27 (×9): qty 1

## 2014-12-27 MED ORDER — KCL IN DEXTROSE-NACL 40-5-0.45 MEQ/L-%-% IV SOLN
INTRAVENOUS | Status: DC
  Administered 2014-12-27: via INTRAVENOUS
  Filled 2014-12-27: qty 1000

## 2014-12-27 MED ORDER — VITAMIN D 1000 UNITS PO TABS
1000.0000 [IU] | ORAL_TABLET | Freq: Every day | ORAL | Status: DC
  Administered 2014-12-28 – 2014-12-31 (×4): 1000 [IU] via ORAL
  Filled 2014-12-27 (×4): qty 1

## 2014-12-27 MED ORDER — B COMPLEX-C PO TABS
1.0000 | ORAL_TABLET | Freq: Every day | ORAL | Status: DC
  Administered 2014-12-28 – 2014-12-31 (×4): 1 via ORAL
  Filled 2014-12-27 (×4): qty 1

## 2014-12-27 MED ORDER — SERTRALINE HCL 100 MG PO TABS: 100.0000 mg | ORAL_TABLET | Freq: Every day | ORAL | Status: DC

## 2014-12-27 MED ORDER — SODIUM CHLORIDE 0.9 % IJ SOLN
3.0000 mL | Freq: Two times a day (BID) | INTRAMUSCULAR | Status: DC
  Administered 2014-12-27 – 2014-12-31 (×6): 3 mL via INTRAVENOUS

## 2014-12-27 MED ORDER — DIAZEPAM 2 MG PO TABS: 2.0000 mg | ORAL_TABLET | Freq: Four times a day (QID) | ORAL | Status: DC | PRN

## 2014-12-27 MED ORDER — CEFTRIAXONE SODIUM 1 G IJ SOLR
INTRAMUSCULAR | Status: AC
  Filled 2014-12-27: qty 10

## 2014-12-27 MED ORDER — LEVOTHYROXINE SODIUM 50 MCG PO TABS: 50.0000 ug | ORAL_TABLET | ORAL | Status: DC

## 2014-12-27 MED ORDER — POTASSIUM CHLORIDE ER 10 MEQ PO TBCR
10.0000 meq | EXTENDED_RELEASE_TABLET | Freq: Two times a day (BID) | ORAL | Status: DC
  Administered 2014-12-27 – 2014-12-31 (×7): 10 meq via ORAL
  Filled 2014-12-27 (×9): qty 1

## 2014-12-27 MED ORDER — CALCIUM CARBONATE ANTACID 500 MG PO CHEW
400.0000 mg | CHEWABLE_TABLET | Freq: Three times a day (TID) | ORAL | Status: DC
  Administered 2014-12-28 – 2014-12-31 (×10): 400 mg via ORAL
  Filled 2014-12-27 (×11): qty 2

## 2014-12-27 MED ORDER — ONDANSETRON HCL 4 MG PO TABS: 4.0000 mg | ORAL_TABLET | Freq: Three times a day (TID) | ORAL | Status: AC | PRN

## 2014-12-27 MED ORDER — PANTOPRAZOLE SODIUM 40 MG PO TBEC
40.0000 mg | DELAYED_RELEASE_TABLET | Freq: Every day | ORAL | Status: DC
  Administered 2014-12-28 – 2014-12-31 (×4): 40 mg via ORAL
  Filled 2014-12-27 (×4): qty 1

## 2014-12-27 MED ORDER — DEXTROSE 5 % IV SOLN
1.0000 g | INTRAVENOUS | Status: DC
  Administered 2014-12-28 – 2014-12-29 (×2): 1 g via INTRAVENOUS
  Filled 2014-12-27 (×3): qty 10

## 2014-12-27 NOTE — H&P (Signed)
[facility] History and Physical  Annaly Skop KNL:[id number] DOB: 1941-03-30 DOA: 12/27/2014  Referring physician: Davonna Belling, MD PCP: Garnet Koyanagi, DO   Chief Complaint: Fall  HPI: Eleonore Shippee is a 74 y.o. female with past medical history of hypothyroidism, hypertension, ulcer arthritis, colitis, rheumatoid arthritis, GERD, anxiety, PUD, migraine headaches, fibromyalgia, and other conditions below who was transferred from [facility] after a fall at that facility when she went to see her primary care doctor. Per patient, she just felt dizzy and weak. She denies loss of consciousness. She denies chest pain, palpitations, diaphoresis, nausea or dyspnea. She denies PND, orthopnea or pitting edema of the lower extremities. She does not have a previous cardiac history, but has been very weak ever since she has been having colitis with chronic diarrhea.   Workup  revealed hypokalemia and an EKG showed bradycardia with PVCs in a bigeminy pattern.  She is currently in no acute distress and denies any other new complaints. See review of systems for further detail.  Review of Systems:  Constitutional:  No weight loss, night sweats, Fevers, chills, fatigue.  HEENT:  No headaches, Difficulty swallowing,Tooth/dental problems,Sore throat,  No sneezing, itching, ear ache, nasal congestion, post nasal drip,  Cardio-vascular:  Positive dizziness, No chest pain, Orthopnea, PND, swelling in lower extremities, anasarca,  palpitations  GI: Chronic diarrhea No heartburn, indigestion, abdominal pain, nausea, vomiting, , change in bowel habits, loss of appetite  Resp:  No shortness of breath with exertion or at rest. No excess mucus, no productive cough, No non-productive cough, No coughing up of blood.No change in color of mucus.No wheezing.No chest wall deformity  Skin:  no rash or lesions.  GU:  no dysuria, change in color of urine, no urgency or frequency. No flank pain.  Musculoskeletal:    No joint pain or swelling. No decreased range of motion. No back pain.  Psych:  No change in mood or affect. No depression or anxiety. No memory loss.   Past Medical History  Diagnosis Date  . Hypothyroidism   . Hypertension   . Osteoarthritis   . Colitis   . Rheumatoid arthritis(714.0)   . GERD (gastroesophageal reflux disease)   . Anxiety   . History of stomach ulcers   . Migraine     "sometimes 2-3 times/month" (09/02/2014)  . Stroke     "I think I had a mild stroke years ago" (09/02/2014)  . Fibromyalgia   . Chronic lower back pain   . Kidney stones   . Squamous cell carcinoma of nose   . Vocal cord paralysis   . Helicobacter pylori (H. pylori) infection    Past Surgical History  Procedure Laterality Date  . Tonsillectomy    . Abdominal hysterectomy    . Cesarean section  1960's X 1  . Laparoscopic cholecystectomy    . Excisional hemorrhoidectomy    . Lumbar disc surgery  X 2    "lower back OR; don't know what they did"  . Cataract extraction w/ intraocular lens  implant, bilateral Bilateral   . Lithotripsy    . Squamous cell carcinoma excision      "nose"  . Kyphoplasty      Dr.Brown   Social History:  reports that she has never smoked. She has never used smokeless tobacco. She reports that she does not drink alcohol or use illicit drugs.  Allergies  Allergen Reactions  . Ciprofloxacin Rash    Doesn't remember   . Leflunomide Diarrhea and Rash  .  Methotrexate Rash    Mouth ulcers  . Nitrofurantoin Rash  . Penicillins Rash  . Salonpas Itching and Rash  . Sulfa Antibiotics Rash and Anxiety  . Tetanus Toxoid Adsorbed Itching  . Tetanus Toxoids Rash, Other (See Comments) and Itching    Family History  Problem Relation Age of Onset  . Coronary artery disease Mother   . Lung cancer Father   . Heart disease Maternal Grandmother   . Heart disease Paternal Grandmother   . Colon cancer Neg Hx     Prior to Admission medications   Medication Sig Start Date  End Date Taking? Authorizing Provider  acetaminophen (TYLENOL) 500 MG tablet Take 1,000 mg by mouth every 6 (six) hours as needed for mild pain.    Historical Provider, MD  B Complex-C (B-COMPLEX WITH VITAMIN C) tablet Take 1 tablet by mouth daily.    Historical Provider, MD  cephALEXin (KEFLEX) 250 MG capsule Take 250 mg by mouth daily.    Historical Provider, MD  cholecalciferol (VITAMIN D) 1000 UNITS tablet Take 1,000 Units by mouth daily.     Historical Provider, MD  CVS B-1 100 MG tablet Take 100 mg by mouth daily. 09/03/14   Historical Provider, MD  diazepam (VALIUM) 5 MG tablet Take 5 mg by mouth. 1/2-1 tablet twice a day    Historical Provider, MD  donepezil (ARICEPT) 10 MG tablet Take 1 tablet daily 12/10/14   Cameron Sprang, MD  etanercept (ENBREL) 50 MG/ML injection Inject 50 mg into the skin once a week.    Historical Provider, MD  folic acid (FOLVITE) 1 MG tablet Take 1 tablet (1 mg total) by mouth daily. 09/02/14   Florencia Reasons, MD  hydroxychloroquine (PLAQUENIL) 200 MG tablet TAKE 1 TABLET (200 MG TOTAL) BY MOUTH DAILY. 10/05/14   Rosalita Chessman, DO  hyoscyamine (LEVBID) 0.375 MG 12 hr tablet Take 1 tablet (0.375 mg total) by mouth 2 (two) times daily. 12/16/14   Rosalita Chessman, DO  levothyroxine (SYNTHROID, LEVOTHROID) 25 MCG tablet 2 TABS ON MWF, AND 1 TAB ON TUES, THURS,SAT,SUN 12/24/14   Rosalita Chessman, DO  metoprolol succinate (TOPROL XL) 100 MG 24 hr tablet Take 1 tablet (100 mg total) by mouth daily. Take with or immediately following a meal. 12/16/14   Rosalita Chessman, DO  Nutritional Supplements (BOOST BREEZE) LIQD 530 CAL / BOTTLE--DRINK 3 A DAY BETWEEN MEALS AND BETWEEN DINNER AND BEDTIME 11/09/14   Rosalita Chessman, DO  ondansetron (ZOFRAN) 4 MG tablet Take 1 tablet (4 mg total) by mouth every 8 (eight) hours as needed for nausea or vomiting. 12/27/14   Rosalita Chessman, DO  pantoprazole (PROTONIX) 40 MG tablet Take 40 mg by mouth daily.    Historical Provider, MD  potassium chloride (K-DUR)  10 MEQ tablet Take 1 tablet (10 mEq total) by mouth once. 12/24/14   Rosalita Chessman, DO  sertraline (ZOLOFT) 100 MG tablet Take 100 mg by mouth daily.    Historical Provider, MD   Physical Exam: Filed Vitals:   12/27/14 1757 12/27/14 1856 12/27/14 2002 12/27/14 2150  BP: 110/63 107/72 114/64 107/58  Pulse: 52 52 53 49  Temp:    97.8 F (36.6 C)  TempSrc:    Oral  Resp: 16 16 20 14   SpO2: 100% 98% 98% 100%    Wt Readings from Last 3 Encounters:  12/27/14 40.552 kg (89 lb 6.4 oz)  12/16/14 42.094 kg (92 lb 12.8 oz)  12/10/14 43.092 kg (  95 lb)    General:  Appears calm and comfortable Eyes: PERRL, normal lids, irises & conjunctiva ENT: grossly normal hearing, lips & tongue are dry. Neck: no LAD, masses or thyromegaly Cardiovascular: Bradycardic at 59 bpm, no m/r/g. No LE edema. Respiratory: CTA bilaterally, no w/r/r. Normal respiratory effort. Abdomen: soft, ntnd Skin: no rash or induration seen on limited exam Musculoskeletal: grossly normal tone BUE/BLE Psychiatric: grossly normal mood and affect, speech fluent and appropriate Neurologic: grossly non-focal.          Labs on Admission:  Basic Metabolic Panel:  Recent Labs Lab 12/27/14 1725  NA 141  K 3.3*  CL 112*  CO2 19*  GLUCOSE 89  BUN 18  CREATININE 1.23*  CALCIUM 9.3  MG 2.2   Liver Function Tests:  Recent Labs Lab 12/27/14 1725  AST 56*  ALT 49  ALKPHOS 40  BILITOT 0.8  PROT 7.8  ALBUMIN 4.2   No results for input(s): LIPASE, AMYLASE in the last 168 hours. No results for input(s): AMMONIA in the last 168 hours. CBC:  Recent Labs Lab 12/27/14 1725  WBC 8.1  NEUTROABS 3.0  HGB 13.1  HCT 36.8  MCV 89.3  PLT 234   Cardiac Enzymes:  Recent Labs Lab 12/27/14 1725  TROPONINI <0.03    Radiological Exams on Admission: Dg Chest 2 View  12/27/2014   CLINICAL DATA:  Syncope  EXAM: CHEST  2 VIEW  COMPARISON:  None  FINDINGS: The heart size and mediastinal contours are within normal limits.  Both lungs are clear. Treated compression fractures noted within the lower thoracic spine.  IMPRESSION: No active cardiopulmonary disease.   Electronically Signed   By: Kerby Moors M.D.   On: 12/27/2014 16:59    EKG: Independently reviewed. Vent. rate 52 BPM PR interval 138 ms QRS duration 82 ms QT/QTc 570/530 ms P-R-T axes 33 56 57\  Sinus bradycardia with frequent Premature ventricular complexes in a pattern of bigeminy Prolonged QT Abnormal ECG  Assessment/Plan Principal Problem:   Symptomatic bradycardia Admit for telemetry monitoring, serial troponin levels, echocardiogram and carotid Doppler.  May need further workup as an outpatient with cardiology.  Active Problems:   Dehydration Will hydrate gently.    Hypokalemia Currently replacing and will follow-up level. The patient needs increased supplementation at home.    Hypothyroidism Continue levothyroxine, but will increase to 100 g every day.    Rheumatoid arthritis Continue Plaquenil and weekly infusions.    FTT (failure to thrive) in adult. Likely related to uncontrolled hypothyroidism and chronic diarrhea. The patient is currently on Megace at home, but the husband does not know the dosage. He will bring the information in the morning.      Depression Continue current treatment.    UTI (urinary tract infection) Continue IV ceftriaxone. Follow-up urine culture and sensitivity.   Code Status: Full code. DVT Prophylaxis: Lovenox SQ. Family Communication:  Shiesha, Jahn" Spouse [phone]  [phone]  Disposition Plan: Admit for telemetry monitoring, serial troponin levels, echocardiogram and carotid Doppler.   Time spent: Over 75 minutes.  Reubin Milan [facility] Pager [phone].

## 2014-12-27 NOTE — ED Provider Notes (Signed)
CSN: [id number]     Arrival date & time 12/27/14  1622 History  This chart was scribed for Davonna Belling, MD by Helane Gunther, ED Scribe. This patient was seen in room MH02/MH02 and the patient's care was started at 4:30 PM.      Chief Complaint  Patient presents with  . Fall   The history is provided by the patient. No language interpreter was used.   HPI Comments: Ann Gonzales is a 74 y.o. female with a PMHx of HTN who presents to the Emergency Department complaining of a fall that occurred just a few minutes ago. She states she has been feeling weak for the past few weeks. She came to the hospital to see Dr Etter Sjogren upstairs. She has also had low BP and pulse, which is why she came in for a checkup. When she went to the restroom afterwards, she felt dizzy and fell. She notes it feels as though her "legs got twisted underneath" her. She reports associated weight loss and diarrhea. She notes she has been feeling cold recently. Pt denies n/v, CP, SOB, fever, HA, and urinary symptoms.  Past Medical History  Diagnosis Date  . Hypothyroidism   . Hypertension   . Osteoarthritis   . Colitis   . Rheumatoid arthritis(714.0)   . GERD (gastroesophageal reflux disease)   . Anxiety   . History of stomach ulcers   . Migraine     "sometimes 2-3 times/month" (09/02/2014)  . Stroke     "I think I had a mild stroke years ago" (09/02/2014)  . Fibromyalgia   . Chronic lower back pain   . Kidney stones   . Squamous cell carcinoma of nose   . Vocal cord paralysis   . Helicobacter pylori (H. pylori) infection    Past Surgical History  Procedure Laterality Date  . Tonsillectomy    . Abdominal hysterectomy    . Cesarean section  1960's X 1  . Laparoscopic cholecystectomy    . Excisional hemorrhoidectomy    . Lumbar disc surgery  X 2    "lower back OR; don't know what they did"  . Cataract extraction w/ intraocular lens  implant, bilateral Bilateral   . Lithotripsy    . Squamous cell carcinoma  excision      "nose"  . Kyphoplasty      Dr.Brown   Family History  Problem Relation Age of Onset  . Coronary artery disease Mother   . Lung cancer Father   . Heart disease Maternal Grandmother   . Heart disease Paternal Grandmother   . Colon cancer Neg Hx    Social History  Substance Use Topics  . Smoking status: Never Smoker   . Smokeless tobacco: Never Used  . Alcohol Use: No   OB History    No data available     Review of Systems  Constitutional: Negative for fever.  Respiratory: Negative for shortness of breath.   Cardiovascular: Negative for chest pain.  Gastrointestinal: Positive for diarrhea. Negative for nausea and vomiting.  Genitourinary: Negative for dysuria, frequency and difficulty urinating.  Neurological: Positive for dizziness, weakness and light-headedness. Negative for headaches.  All other systems reviewed and are negative.   Allergies  Ciprofloxacin; Leflunomide; Methotrexate; Nitrofurantoin; Penicillins; Salonpas; Sulfa antibiotics; Tetanus toxoid adsorbed; and Tetanus toxoids  Home Medications   Prior to Admission medications   Medication Sig Start Date End Date Taking? Authorizing Provider  acetaminophen (TYLENOL) 500 MG tablet Take 1,000 mg by mouth every 6 (six) hours  as needed for mild pain.    Historical Provider, MD  B Complex-C (B-COMPLEX WITH VITAMIN C) tablet Take 1 tablet by mouth daily.    Historical Provider, MD  cephALEXin (KEFLEX) 250 MG capsule Take 250 mg by mouth daily.    Historical Provider, MD  cholecalciferol (VITAMIN D) 1000 UNITS tablet Take 1,000 Units by mouth daily.     Historical Provider, MD  CVS B-1 100 MG tablet Take 100 mg by mouth daily. 09/03/14   Historical Provider, MD  diazepam (VALIUM) 5 MG tablet Take 5 mg by mouth. 1/2-1 tablet twice a day    Historical Provider, MD  donepezil (ARICEPT) 10 MG tablet Take 1 tablet daily 12/10/14   Cameron Sprang, MD  etanercept (ENBREL) 50 MG/ML injection Inject 50 mg into the  skin once a week.    Historical Provider, MD  folic acid (FOLVITE) 1 MG tablet Take 1 tablet (1 mg total) by mouth daily. 09/02/14   Florencia Reasons, MD  hydroxychloroquine (PLAQUENIL) 200 MG tablet TAKE 1 TABLET (200 MG TOTAL) BY MOUTH DAILY. 10/05/14   Rosalita Chessman, DO  hyoscyamine (LEVBID) 0.375 MG 12 hr tablet Take 1 tablet (0.375 mg total) by mouth 2 (two) times daily. 12/16/14   Rosalita Chessman, DO  levothyroxine (SYNTHROID, LEVOTHROID) 25 MCG tablet 2 TABS ON MWF, AND 1 TAB ON TUES, THURS,SAT,SUN 12/24/14   Rosalita Chessman, DO  metoprolol succinate (TOPROL XL) 100 MG 24 hr tablet Take 1 tablet (100 mg total) by mouth daily. Take with or immediately following a meal. 12/16/14   Rosalita Chessman, DO  Nutritional Supplements (BOOST BREEZE) LIQD 530 CAL / BOTTLE--DRINK 3 A DAY BETWEEN MEALS AND BETWEEN DINNER AND BEDTIME 11/09/14   Rosalita Chessman, DO  ondansetron (ZOFRAN) 4 MG tablet Take 1 tablet (4 mg total) by mouth every 8 (eight) hours as needed for nausea or vomiting. 12/27/14   Rosalita Chessman, DO  pantoprazole (PROTONIX) 40 MG tablet Take 40 mg by mouth daily.    Historical Provider, MD  potassium chloride (K-DUR) 10 MEQ tablet Take 1 tablet (10 mEq total) by mouth once. 12/24/14   Rosalita Chessman, DO  sertraline (ZOLOFT) 100 MG tablet Take 100 mg by mouth daily.    Historical Provider, MD   BP 107/58 mmHg  Pulse 49  Temp(Src) 97.8 F (36.6 C) (Oral)  Resp 14  SpO2 100% Physical Exam  Constitutional: She appears well-developed and well-nourished.  Awake and appropriate  HENT:  Head: Normocephalic and atraumatic.  No TTP on head  Eyes: Conjunctivae are normal. Right eye exhibits no discharge. Left eye exhibits no discharge.  Cardiovascular:  Heart is bradycardic with quiet auscultation. Weak radial pulses. No peripheral edema.  Pulmonary/Chest: Effort normal and breath sounds normal. No respiratory distress.  Lungs clear to auscultation  Neurological: She is alert. Coordination normal.  Skin: Skin  is dry. No rash noted. She is not diaphoretic. No erythema.  She is cool to the touch  Psychiatric: She has a normal mood and affect.  Nursing note and vitals reviewed.   ED Course  Procedures  DIAGNOSTIC STUDIES: Oxygen Saturation is 97% on RA, adequate by my interpretation.    COORDINATION OF CARE: 4:36 PM - Discussed plans to order diagnostic studies. Pt advised of plan for treatment and pt agrees.  Labs Review Labs Reviewed  URINALYSIS, ROUTINE W REFLEX MICROSCOPIC (NOT AT [facility]) - Abnormal; Notable for the following:    Color, Urine AMBER (*)  APPearance TURBID (*)    Protein, ur 100 (*)    Nitrite POSITIVE (*)    Leukocytes, UA LARGE (*)    All other components within normal limits  COMPREHENSIVE METABOLIC PANEL - Abnormal; Notable for the following:    Potassium 3.3 (*)    Chloride 112 (*)    CO2 19 (*)    Creatinine, Ser 1.23 (*)    AST 56 (*)    GFR calc non Af Amer 42 (*)    GFR calc Af Amer 49 (*)    All other components within normal limits  CBC WITH DIFFERENTIAL/PLATELET - Abnormal; Notable for the following:    RDW 15.6 (*)    Neutrophils Relative % 37 (*)    Monocytes Relative 13 (*)    All other components within normal limits  TSH - Abnormal; Notable for the following:    TSH >90.000 (*)    All other components within normal limits  URINE MICROSCOPIC-ADD ON - Abnormal; Notable for the following:    Squamous Epithelial / LPF FEW (*)    Bacteria, UA MANY (*)    All other components within normal limits  URINE CULTURE  TROPONIN I  MAGNESIUM  T3, FREE  T4, FREE  CBC  CREATININE, SERUM  TROPONIN I  TROPONIN I  BASIC METABOLIC PANEL  PHOSPHORUS  I-STAT CG4 LACTIC ACID, ED  I-STAT CG4 LACTIC ACID, ED    Imaging Review Dg Chest 2 View  12/27/2014   CLINICAL DATA:  Syncope  EXAM: CHEST  2 VIEW  COMPARISON:  None  FINDINGS: The heart size and mediastinal contours are within normal limits. Both lungs are clear. Treated compression fractures noted  within the lower thoracic spine.  IMPRESSION: No active cardiopulmonary disease.   Electronically Signed   By: Kerby Moors M.D.   On: 12/27/2014 16:59   I have personally reviewed and evaluated these images and lab results as part of my medical decision-making.   EKG Interpretation   Date/Time:  Monday December 27 2014 16:29:05 EDT Ventricular Rate:  52 PR Interval:  138 QRS Duration: 82 QT Interval:  570 QTC Calculation: 530 R Axis:   56 Text Interpretation:  Sinus bradycardia with frequent Premature  ventricular complexes in a pattern of bigeminy Prolonged QT Abnormal ECG  presistant bigeminy Confirmed by Alvino Chapel  MD, Ovid Curd [phone]) on  12/27/2014 4:38:40 PM      MDM   Final diagnoses:  Acute cystitis without hematuria  Bradycardia  Renal insufficiency    Patient with fall and generalized weakness. Has a sinus bradycardia with ventricular bigeminy. Also has some renal insufficiency. Has UTI. Will admit to internal medicine at [facility]. Discussed with Dr. Olevia Bowens.  I personally performed the services described in this documentation, which was scribed in my presence. The recorded information has been reviewed and is accurate.    Davonna Belling, MD 12/27/14 [phone]

## 2014-12-27 NOTE — ED Notes (Signed)
Lactic Acid resulted prior to transfer at 17:39

## 2014-12-27 NOTE — Patient Instructions (Signed)
Chronic Diarrhea  Diarrhea is frequent loose and watery bowel movements. It can cause you to feel weak and dehydrated. Dehydration can cause you to become tired and thirsty and to have a dry mouth, decreased urination, and dark yellow urine. Diarrhea is a sign of another problem, most often an infection that will not last long. In most cases, diarrhea lasts 2-3 days. Diarrhea that lasts longer than 4 weeks is called long-lasting (chronic) diarrhea. It is important to treat your diarrhea as directed by your health care provider to lessen or prevent future episodes of diarrhea.   CAUSES   There are many causes of chronic diarrhea. The following are some possible causes:   · Gastrointestinal infections caused by viruses, bacteria, or parasites.    · Food poisoning or food allergies.    · Certain medicines, such as antibiotics, chemotherapy, and laxatives.    · Artificial sweeteners and fructose.    · Digestive disorders, such as celiac disease and inflammatory bowel diseases.    · Irritable bowel syndrome.  · Some disorders of the pancreas.  · Disorders of the thyroid.  · Reduced blood flow to the intestines.  · Cancer.  Sometimes the cause of chronic diarrhea is unknown.  RISK FACTORS  · Having a severely weakened immune system, such as from HIV or AIDS.    · Taking certain types of cancer-fighting drugs (such as with chemotherapy) or other medicines.    · Having had a recent organ transplant.    · Having a portion of the stomach or small bowel removed.    · Traveling to countries where food and water supplies are often contaminated.    SYMPTOMS   In addition to frequent, loose stools, diarrhea may cause:   · Cramping.    · Abdominal pain.    · Nausea.    · Fever.  · Fatigue.  · Urgent need to use the bathroom.  · Loss of bowel control.  DIAGNOSIS   Your health care provider must take a careful history and perform a physical exam. Tests given are based on your symptoms and history. Tests may include:   · Blood or  stool tests. Three or more stool samples may be examined. Stool cultures may be used to test for bacteria or parasites.    · X-rays.    · A procedure in which a thin tube is inserted into the mouth or rectum (endoscopy). This allows the health care provider to look inside the intestine.    TREATMENT   · Treatment is aimed at correcting the cause of the diarrhea when possible.  · Diarrhea caused by an infection can often be treated with antibiotic medicines.  · Diarrhea not caused by an infection may require you to take long-term medicine or have surgery. Specific treatment should be discussed with your health care provider.  · If the cause cannot be determined, treatment aims to relieve symptoms and prevent dehydration. Serious health problems can occur if you do not maintain proper fluid levels. Treatment may include:  ¨ Taking an oral rehydration solution (ORS).  ¨ Not drinking beverages that contain caffeine (such as tea, coffee, and soft drinks).  ¨ Not drinking alcohol.  ¨ Maintaining well-balanced nutrition to help you recover faster.  HOME CARE INSTRUCTIONS   · Drink enough fluids to keep urine clear or pale yellow. Drink 1 cup (8 oz) of fluid for each diarrhea episode. Avoid fluids that contain simple sugars, fruit juices, whole milk products, and sodas. Hydrate with an ORS. You may purchase the ORS or prepare it at home by mixing the   following ingredients together:  ¨  - tsp (1.7-3  mL) table salt.  ¨ ¾ tsp (3 ¾ mL) baking soda.  ¨  tsp (1.7 mL) salt substitute containing potassium chloride.  ¨ 1 tbsp (20 mL) sugar.  ¨ 4.2 c (1 L) of water.    · Certain foods and beverages may increase the speed at which food moves through the gastrointestinal (GI) tract. These foods and beverages should be avoided. They include:  ¨ Caffeinated and alcoholic beverages.  ¨ High-fiber foods, such as raw fruits and vegetables, nuts, seeds, and whole grain breads and cereals.  ¨ Foods and beverages sweetened with sugar  alcohols, such as xylitol, sorbitol, and mannitol.    · Some foods may be well tolerated and may help thicken stool. These include:  ¨ Starchy foods, such as rice, toast, pasta, low-sugar cereal, oatmeal, grits, baked potatoes, crackers, and bagels.  ¨ Bananas.  ¨ Applesauce.  · Add probiotic-rich foods to help increase healthy bacteria in the GI tract. These include yogurt and fermented milk products.  · Wash your hands well after each diarrhea episode.  · Only take over-the-counter or prescription medicines as directed by your health care provider.  · Take a warm bath to relieve any burning or pain from frequent diarrhea episodes.  SEEK MEDICAL CARE IF:   · You are not urinating as often.  · Your urine is a dark color.  · You become very tired or dizzy.  · You have severe pain in the abdomen or rectum.  · Your have blood or pus in your stools.  · Your stools look black and tarry.  SEEK IMMEDIATE MEDICAL CARE IF:   · You are unable to keep fluids down.  · You have persistent vomiting.  · You have blood in your stool.  · Your stools are black and tarry.  · You do not urinate in 6-8 hours, or there is only a small amount of very dark urine.  · You have abdominal pain that increases or localizes.  · You have weakness, dizziness, confusion, or lightheadedness.  · You have a severe headache.  · Your diarrhea gets worse or does not get better.  · You have a fever or persistent symptoms for more than 2-3 days.  · You have a fever and your symptoms suddenly get worse.  MAKE SURE YOU:   · Understand these instructions.  · Will watch your condition.  · Will get help right away if you are not doing well or get worse.  Document Released: 07/14/2003 Document Revised: 04/28/2013 Document Reviewed: 10/16/2012  Exit[employer]® Patient Information ©2015 [employer]. This information is not intended to replace advice given to you by your health care provider. Make sure you discuss any questions you have with your health care  provider.

## 2014-12-27 NOTE — Progress Notes (Signed)
Patient ID: Ann Gonzales, female   DOB: [birth year], 74 y.o.   MRN: [id number]   Subjective:    Patient ID: Ann Gonzales, female    DOB: April 15, 1941, 74 y.o.   MRN: [id number]  Chief Complaint  Patient presents with  . Fatigue    and loss of appetite, getting worst.  . Diarrhea    and nausea that has been ongoing    HPI Patient is in today for f/u diarrhea and decreased appetite.  She has diarrhea everytime she tries to eat something and is also nauseous everytime she puts food in her mouth.  Symptoms have NOT improved at all since last visit.   Daughter in law is present.  Past Medical History  Diagnosis Date  . Hypothyroidism   . Hypertension   . Osteoarthritis   . Colitis   . Rheumatoid arthritis(714.0)   . GERD (gastroesophageal reflux disease)   . Anxiety   . History of stomach ulcers   . Migraine     "sometimes 2-3 times/month" (09/02/2014)  . Stroke     "I think I had a mild stroke years ago" (09/02/2014)  . Fibromyalgia   . Chronic lower back pain   . Kidney stones   . Squamous cell carcinoma of nose   . Vocal cord paralysis   . Helicobacter pylori (H. pylori) infection     Past Surgical History  Procedure Laterality Date  . Tonsillectomy    . Abdominal hysterectomy    . Cesarean section  1960's X 1  . Laparoscopic cholecystectomy    . Excisional hemorrhoidectomy    . Lumbar disc surgery  X 2    "lower back OR; don't know what they did"  . Cataract extraction w/ intraocular lens  implant, bilateral Bilateral   . Lithotripsy    . Squamous cell carcinoma excision      "nose"  . Kyphoplasty      Dr.Brown    Family History  Problem Relation Age of Onset  . Coronary artery disease Mother   . Lung cancer Father   . Heart disease Maternal Grandmother   . Heart disease Paternal Grandmother   . Colon cancer Neg Hx     Social History   Social History  . Marital Status: Married    Spouse Name: N/A  . Number of Children: 1  . Years of Education: N/A    Occupational History  . retired    Social History Main Topics  . Smoking status: Never Smoker   . Smokeless tobacco: Never Used  . Alcohol Use: No  . Drug Use: No  . Sexual Activity: Yes   Other Topics Concern  . Not on file   Social History Narrative    No facility-administered medications prior to visit.   Outpatient Prescriptions Prior to Visit  Medication Sig Dispense Refill  . acetaminophen (TYLENOL) 500 MG tablet Take 1,000 mg by mouth 2 (two) times daily.     . B Complex-C (B-COMPLEX WITH VITAMIN C) tablet Take 1 tablet by mouth at bedtime.     . cephALEXin (KEFLEX) 250 MG capsule Take 250 mg by mouth at bedtime.     . cholecalciferol (VITAMIN D) 1000 UNITS tablet Take 1,000 Units by mouth at bedtime.     . CVS B-1 100 MG tablet Take 100 mg by mouth at bedtime.   0  . diazepam (VALIUM) 5 MG tablet Take 2.5 mg by mouth at bedtime.     Marland Kitchen etanercept (ENBREL) 50 MG/ML injection  Inject 50 mg into the skin once a week.    . folic acid (FOLVITE) 1 MG tablet Take 1 tablet (1 mg total) by mouth daily. 30 tablet 3  . hydroxychloroquine (PLAQUENIL) 200 MG tablet TAKE 1 TABLET (200 MG TOTAL) BY MOUTH DAILY. (Patient taking differently: TAKE 1 TABLET (200 MG TOTAL) BY MOUTH  every evening.) 30 tablet 3  . hyoscyamine (LEVBID) 0.375 MG 12 hr tablet Take 1 tablet (0.375 mg total) by mouth 2 (two) times daily. (Patient taking differently: Take 0.375 mg by mouth at bedtime. ) 60 tablet 0  . levothyroxine (SYNTHROID, LEVOTHROID) 25 MCG tablet 2 TABS ON MWF, AND 1 TAB ON TUES, THURS,SAT,SUN (Patient taking differently: Take 25-50 mcg by mouth daily before breakfast. 50 mg on Mon, Wed, and Fri.  25 mg on Tues, Thurs, Sat, and Sun) 42 tablet 2  . metoprolol succinate (TOPROL XL) 100 MG 24 hr tablet Take 1 tablet (100 mg total) by mouth daily. Take with or immediately following a meal. 30 tablet 5  . Nutritional Supplements (BOOST BREEZE) LIQD 530 CAL / BOTTLE--DRINK 3 A DAY BETWEEN MEALS AND  BETWEEN DINNER AND BEDTIME (Patient taking differently: 530 CAL / BOTTLE--DRINK 1 A DAY BETWEEN MEALS) 6399 mL 3  . pantoprazole (PROTONIX) 40 MG tablet Take 40 mg by mouth at bedtime.     . potassium chloride (K-DUR) 10 MEQ tablet Take 1 tablet (10 mEq total) by mouth once. 30 tablet 1  . donepezil (ARICEPT) 10 MG tablet Take 1 tablet daily (Patient not taking: Reported on 12/28/2014) 90 tablet 3  . sertraline (ZOLOFT) 100 MG tablet Take 100 mg by mouth daily.      Allergies  Allergen Reactions  . Ciprofloxacin Rash    Doesn't remember   . Leflunomide Diarrhea and Rash  . Methotrexate Rash    Mouth ulcers  . Nitrofurantoin Rash  . Penicillins Rash  . Salonpas Itching and Rash  . Sulfa Antibiotics Anxiety and Rash    hallucinations  . Tetanus Toxoid Adsorbed Itching  . Tetanus Toxoids Rash, Other (See Comments) and Itching    Review of Systems  Constitutional: Positive for weight loss. Negative for fever, chills and malaise/fatigue.  HENT: Negative for congestion and hearing loss.   Eyes: Negative for discharge.  Respiratory: Negative for cough, sputum production and shortness of breath.   Cardiovascular: Negative for chest pain, palpitations and leg swelling.  Gastrointestinal: Positive for diarrhea. Negative for heartburn, nausea, vomiting, abdominal pain, constipation and blood in stool.  Genitourinary: Negative for dysuria, urgency, frequency and hematuria.  Musculoskeletal: Negative for myalgias, back pain and falls.  Skin: Negative for rash.  Neurological: Negative for dizziness, sensory change, loss of consciousness, weakness and headaches.  Endo/Heme/Allergies: Negative for environmental allergies. Does not bruise/bleed easily.  Psychiatric/Behavioral: Positive for memory loss.       Objective:    Physical Exam  Constitutional: She is oriented to person, place, and time. She appears well-developed and well-nourished. She appears cachectic. She is cooperative.  HENT:    Head: Normocephalic and atraumatic.  Eyes: Conjunctivae and EOM are normal.  Neck: Normal range of motion. Neck supple. No JVD present. Carotid bruit is not present. No thyromegaly present.  Cardiovascular: Normal rate, regular rhythm and normal heart sounds.   No murmur heard. Pulmonary/Chest: Effort normal and breath sounds normal. No respiratory distress. She has no wheezes. She has no rales. She exhibits no tenderness.  Musculoskeletal: She exhibits no edema.  Neurological: She is alert and oriented  to person, place, and time.  Psychiatric: Her affect is blunt.    BP 98/68 mmHg  Pulse 69  Temp(Src) 98 F (36.7 C) (Oral)  Wt 89 lb 6.4 oz (40.552 kg)  SpO2 95% Wt Readings from Last 3 Encounters:  12/28/14 93 lb 4.1 oz (42.3 kg)  12/27/14 89 lb 6.4 oz (40.552 kg)  12/16/14 92 lb 12.8 oz (42.094 kg)     Lab Results  Component Value Date   WBC 7.3 12/28/2014   HGB 11.7* 12/28/2014   HCT 32.3* 12/28/2014   PLT 181 12/28/2014   GLUCOSE 96 12/28/2014   ALT 49 12/27/2014   AST 56* 12/27/2014   NA 139 12/28/2014   K 3.2* 12/28/2014   CL 117* 12/28/2014   CREATININE 0.97 12/28/2014   BUN 12 12/28/2014   CO2 16* 12/28/2014   TSH >90.000* 12/27/2014    Lab Results  Component Value Date   TSH >90.000* 12/27/2014   Lab Results  Component Value Date   WBC 7.3 12/28/2014   HGB 11.7* 12/28/2014   HCT 32.3* 12/28/2014   MCV 87.8 12/28/2014   PLT 181 12/28/2014   Lab Results  Component Value Date   NA 139 12/28/2014   K 3.2* 12/28/2014   CO2 16* 12/28/2014   GLUCOSE 96 12/28/2014   BUN 12 12/28/2014   CREATININE 0.97 12/28/2014   BILITOT 0.8 12/27/2014   ALKPHOS 40 12/27/2014   AST 56* 12/27/2014   ALT 49 12/27/2014   PROT 7.8 12/27/2014   ALBUMIN 4.2 12/27/2014   CALCIUM 8.4* 12/28/2014   ANIONGAP 6 12/28/2014   GFR 49.46* 12/16/2014   No results found for: CHOL No results found for: HDL No results found for: LDLCALC No results found for: TRIG No results  found for: CHOLHDL No results found for: HGBA1C     Assessment & Plan:   Problem List Items Addressed This Visit    Hypothyroidism - Primary   Relevant Orders   Ambulatory referral to Endocrinology   T3, free   T4, free   TSH   POCT Urinalysis Dipstick   Hypokalemia   Relevant Orders   Potassium    Other Visit Diagnoses    Chronic diarrhea        Relevant Orders    Ambulatory referral to Gastroenterology    Clostridium difficile EIA    Stool culture    Nausea without vomiting        Relevant Medications    ondansetron (ZOFRAN) 4 MG tablet    Other Relevant Orders    Ambulatory referral to Gastroenterology       I am having Ms. Desai start on ondansetron. I am also having her maintain her diazepam, etanercept, pantoprazole, cholecalciferol, folic acid, hydroxychloroquine, CVS B-1, acetaminophen, BOOST BREEZE, cephALEXin, B-complex with vitamin C, hyoscyamine, metoprolol succinate, levothyroxine, and potassium chloride.  Meds ordered this encounter  Medications  . ondansetron (ZOFRAN) 4 MG tablet    Sig: Take 1 tablet (4 mg total) by mouth every 8 (eight) hours as needed for nausea or vomiting.    Dispense:  30 tablet    Refill:  0     Garnet Koyanagi, DO

## 2014-12-27 NOTE — ED Notes (Signed)
Pt was being evaluated in clinic upstairs, got ready to wash her hands, has had dizziness and hx of hypotension and fall r/t dizziness.  C/o pain in R trapezius area and L elbow.  Denies loc, alert and oriented.

## 2014-12-27 NOTE — ED Notes (Signed)
Phone report given to [facility]

## 2014-12-27 NOTE — ED Notes (Signed)
Call to [facility] unable to take report at this time for room 6E-14

## 2014-12-27 NOTE — Progress Notes (Signed)
Pre visit review using our clinic review tool, if applicable. No additional management support is needed unless otherwise documented below in the visit note. 

## 2014-12-28 ENCOUNTER — Observation Stay (HOSPITAL_COMMUNITY): Payer: Medicare Other

## 2014-12-28 DIAGNOSIS — E039 Hypothyroidism, unspecified: Secondary | ICD-10-CM | POA: Diagnosis present

## 2014-12-28 DIAGNOSIS — E063 Autoimmune thyroiditis: Secondary | ICD-10-CM | POA: Diagnosis present

## 2014-12-28 DIAGNOSIS — M545 Low back pain: Secondary | ICD-10-CM | POA: Diagnosis present

## 2014-12-28 DIAGNOSIS — F329 Major depressive disorder, single episode, unspecified: Secondary | ICD-10-CM | POA: Diagnosis present

## 2014-12-28 DIAGNOSIS — R008 Other abnormalities of heart beat: Secondary | ICD-10-CM | POA: Diagnosis present

## 2014-12-28 DIAGNOSIS — R001 Bradycardia, unspecified: Secondary | ICD-10-CM | POA: Diagnosis present

## 2014-12-28 DIAGNOSIS — Z8711 Personal history of peptic ulcer disease: Secondary | ICD-10-CM | POA: Diagnosis not present

## 2014-12-28 DIAGNOSIS — K529 Noninfective gastroenteritis and colitis, unspecified: Secondary | ICD-10-CM | POA: Diagnosis present

## 2014-12-28 DIAGNOSIS — E876 Hypokalemia: Secondary | ICD-10-CM | POA: Diagnosis present

## 2014-12-28 DIAGNOSIS — N289 Disorder of kidney and ureter, unspecified: Secondary | ICD-10-CM | POA: Diagnosis present

## 2014-12-28 DIAGNOSIS — Z85828 Personal history of other malignant neoplasm of skin: Secondary | ICD-10-CM | POA: Diagnosis not present

## 2014-12-28 DIAGNOSIS — Z8673 Personal history of transient ischemic attack (TIA), and cerebral infarction without residual deficits: Secondary | ICD-10-CM | POA: Diagnosis not present

## 2014-12-28 DIAGNOSIS — R627 Adult failure to thrive: Secondary | ICD-10-CM | POA: Diagnosis present

## 2014-12-28 DIAGNOSIS — R42 Dizziness and giddiness: Secondary | ICD-10-CM | POA: Diagnosis not present

## 2014-12-28 DIAGNOSIS — W1800XA Striking against unspecified object with subsequent fall, initial encounter: Secondary | ICD-10-CM | POA: Insufficient documentation

## 2014-12-28 DIAGNOSIS — K219 Gastro-esophageal reflux disease without esophagitis: Secondary | ICD-10-CM | POA: Diagnosis present

## 2014-12-28 DIAGNOSIS — Z79899 Other long term (current) drug therapy: Secondary | ICD-10-CM | POA: Diagnosis not present

## 2014-12-28 DIAGNOSIS — M069 Rheumatoid arthritis, unspecified: Secondary | ICD-10-CM | POA: Diagnosis present

## 2014-12-28 DIAGNOSIS — E43 Unspecified severe protein-calorie malnutrition: Secondary | ICD-10-CM | POA: Diagnosis present

## 2014-12-28 DIAGNOSIS — I10 Essential (primary) hypertension: Secondary | ICD-10-CM | POA: Diagnosis present

## 2014-12-28 DIAGNOSIS — G8929 Other chronic pain: Secondary | ICD-10-CM | POA: Diagnosis present

## 2014-12-28 DIAGNOSIS — J3800 Paralysis of vocal cords and larynx, unspecified: Secondary | ICD-10-CM | POA: Diagnosis present

## 2014-12-28 DIAGNOSIS — M797 Fibromyalgia: Secondary | ICD-10-CM | POA: Diagnosis present

## 2014-12-28 DIAGNOSIS — Z880 Allergy status to penicillin: Secondary | ICD-10-CM | POA: Diagnosis not present

## 2014-12-28 DIAGNOSIS — Z681 Body mass index (BMI) 19 or less, adult: Secondary | ICD-10-CM | POA: Diagnosis not present

## 2014-12-28 DIAGNOSIS — N3000 Acute cystitis without hematuria: Secondary | ICD-10-CM | POA: Diagnosis present

## 2014-12-28 DIAGNOSIS — E860 Dehydration: Secondary | ICD-10-CM | POA: Diagnosis present

## 2014-12-28 LAB — BASIC METABOLIC PANEL
Anion gap: 6 (ref 5–15)
BUN: 12 mg/dL (ref 6–20)
CHLORIDE: 117 mmol/L — AB (ref 101–111)
CO2: 16 mmol/L — ABNORMAL LOW (ref 22–32)
CREATININE: 0.97 mg/dL (ref 0.44–1.00)
Calcium: 8.4 mg/dL — ABNORMAL LOW (ref 8.9–10.3)
GFR calc non Af Amer: 56 mL/min — ABNORMAL LOW (ref >60)
Glucose, Bld: 96 mg/dL (ref 65–99)
POTASSIUM: 3.2 mmol/L — AB (ref 3.5–5.1)
SODIUM: 139 mmol/L (ref 135–145)

## 2014-12-28 LAB — T4, FREE
FREE T4: 0.67 ng/dL (ref 0.61–1.12)
FREE T4: 0.54 ng/dL — AB (ref 0.61–1.12)

## 2014-12-28 LAB — CBC
HEMATOCRIT: 32.3 % — AB (ref 36.0–46.0)
Hemoglobin: 11.7 g/dL — ABNORMAL LOW (ref 12.0–15.0)
MCH: 31.8 pg (ref 26.0–34.0)
MCHC: 36.2 g/dL — ABNORMAL HIGH (ref 30.0–36.0)
MCV: 87.8 fL (ref 78.0–100.0)
PLATELETS: 181 10*3/uL (ref 150–400)
RBC: 3.68 MIL/uL — ABNORMAL LOW (ref 3.87–5.11)
RDW: 16 % — AB (ref 11.5–15.5)
WBC: 7.3 10*3/uL (ref 4.0–10.5)

## 2014-12-28 LAB — CREATININE, SERUM
Creatinine, Ser: 1.1 mg/dL — ABNORMAL HIGH (ref 0.44–1.00)
GFR calc Af Amer: 56 mL/min — ABNORMAL LOW (ref >60)
GFR calc non Af Amer: 48 mL/min — ABNORMAL LOW (ref >60)

## 2014-12-28 LAB — T3, FREE: T3, Free: 1.4 pg/mL — ABNORMAL LOW (ref 2.0–4.4)

## 2014-12-28 LAB — TROPONIN I
Troponin I: <0.03 ng/mL (ref <0.031)
Troponin I: <0.03 ng/mL (ref <0.031)

## 2014-12-28 LAB — CORTISOL: CORTISOL PLASMA: 16.4 ug/dL

## 2014-12-28 LAB — PHOSPHORUS: PHOSPHORUS: 2.2 mg/dL — AB (ref 2.5–4.6)

## 2014-12-28 MED ORDER — LEVOTHYROXINE SODIUM 100 MCG IV SOLR
50.0000 ug | Freq: Every day | INTRAVENOUS | Status: DC
  Administered 2014-12-28: 50 ug via INTRAVENOUS
  Filled 2014-12-28 (×3): qty 5

## 2014-12-28 MED ORDER — BOOST / RESOURCE BREEZE PO LIQD
1.0000 | Freq: Two times a day (BID) | ORAL | Status: DC
  Administered 2014-12-28 – 2014-12-30 (×3): 1 via ORAL

## 2014-12-28 MED ORDER — POTASSIUM CHLORIDE 10 MEQ/100ML IV SOLN
10.0000 meq | INTRAVENOUS | Status: AC
  Administered 2014-12-28 (×2): 10 meq via INTRAVENOUS
  Filled 2014-12-28 (×3): qty 100

## 2014-12-28 MED ORDER — DIAZEPAM 2 MG PO TABS: 2.0000 mg | ORAL_TABLET | Freq: Every evening | ORAL | Status: DC | PRN

## 2014-12-28 MED ORDER — SODIUM CHLORIDE 0.9 % IV SOLN
INTRAVENOUS | Status: AC
  Administered 2014-12-28 – 2014-12-29 (×2): via INTRAVENOUS

## 2014-12-28 MED ORDER — LEVOTHYROXINE SODIUM 100 MCG PO TABS: 100.0000 ug | ORAL_TABLET | ORAL | Status: DC

## 2014-12-28 MED ORDER — POTASSIUM CHLORIDE CRYS ER 20 MEQ PO TBCR
40.0000 meq | EXTENDED_RELEASE_TABLET | Freq: Once | ORAL | Status: AC
  Administered 2014-12-28: 40 meq via ORAL
  Filled 2014-12-28: qty 2

## 2014-12-28 NOTE — Progress Notes (Signed)
*  PRELIMINARY RESULTS* Vascular Ultrasound Carotid Duplex (Doppler) has been completed.  Preliminary findings: Bilateral:  1-39% ICA stenosis.  Vertebral artery flow is antegrade.      Landry Mellow, RDMS, RVT  12/28/2014, 1:35 PM

## 2014-12-28 NOTE — Progress Notes (Signed)
Patient Demographics:    Ann Gonzales, is a 74 y.o. female, DOB - [birth year], [id number]  Admit date - 12/27/2014   Admitting Physician Reubin Milan, MD  Outpatient Primary MD for the patient is Garnet Koyanagi, DO  LOS - 1   Chief Complaint  Patient presents with  . Fall        Subjective:    [name] today has, No headache, No chest pain, No abdominal pain - No Nausea, No new weakness tingling or numbness, No Cough - SOB. Does have some generalized weakness and cold intolerance.   Assessment  & Plan :    1. Generalized weakness, fall, cold intolerance, mild bradycardia and lightheadedness secondary to severe hypothyroidism - TSH few months ago was actually low and now over 90. She likely has Hashimoto thyroiditis, have switched her to IV Synthroid, hold her beta blocker, have ordered free T4 and T3, anti-TPO antibodies, will discuss her case with endocrinologist Dr. Loanne Drilling.  2. UTI. On Rocephin monitor.   3. Generalized weakness. Due to #1 above, monitor on telemetry, PT eval, increase activity monitor blood pressure.   4. Bradycardia most likely from #1 above. Blood pressure stable, discontinue beta blocker, treat hypothyroidism and monitor.   5. Prolonged QTC. Discontinued SSRIs and all QTC prolonging agents, replace potassium, monitor on telemetry. Magnesium is stable. Echo pending.   6. Hypokalemia . Potassium replaced will monitor.   7. Rheumatoid arthritis. Continue hydroxychloroquine for now.    Code Status : Full  Family Communication  : None present  Disposition Plan  : To be decided  Consults  :  Endocrinologist Dr. Loanne Drilling over the phone  Procedures  :    DVT Prophylaxis  :  Lovenox    Lab Results  Component Value Date   PLT 181 12/28/2014     Inpatient Medications  Scheduled Meds: . B-complex with vitamin C  1 tablet Oral Daily  . calcium carbonate  400 mg of elemental calcium Oral TID WC  . cefTRIAXone (ROCEPHIN)  IV  1 g Intravenous Q24H  . cholecalciferol  1,000 Units Oral Daily  . enoxaparin (LOVENOX) injection  20 mg Subcutaneous Q24H  . folic acid  1 mg Oral Daily  . hydroxychloroquine  200 mg Oral Daily  . hyoscyamine  0.375 mg Oral BID  . levothyroxine  50 mcg Intravenous Daily  . pantoprazole  40 mg Oral Daily  . potassium chloride  10 mEq Oral BID  . potassium chloride  10 mEq Intravenous Q1 Hr x 4  . sodium chloride  3 mL Intravenous Q12H   Continuous Infusions: . sodium chloride     PRN Meds:.diazepam  Antibiotics  :     Anti-infectives    Start     Dose/Rate Route Frequency Ordered Stop   12/28/14 1500  cefTRIAXone (ROCEPHIN) 1 g in dextrose 5 % 50 mL IVPB     1 g 100 mL/hr over 30 Minutes Intravenous Every 24 hours 12/27/14 2225     12/28/14 1000  hydroxychloroquine (PLAQUENIL) tablet 200 mg     200 mg Oral Daily 12/27/14 2256     12/27/14 1727  cefTRIAXone (ROCEPHIN) 1 G injection    Comments:  Miguel Rota   : cabinet override  12/27/14 1727 12/27/14 2019   12/27/14 1708  cefTRIAXone (ROCEPHIN) 1 G injection  Status:  Discontinued    Comments:  Miguel Rota   : cabinet override      12/27/14 1708 12/27/14 1730   12/27/14 1700  cefTRIAXone (ROCEPHIN) 1 g in dextrose 5 % 50 mL IVPB     1 g 100 mL/hr over 30 Minutes Intravenous  Once 12/27/14 1658 12/27/14 1806        Objective:   Filed Vitals:   12/27/14 2002 12/27/14 2150 12/28/14 0605 12/28/14 1000  BP: 114/64 107/58 94/53 94/60   Pulse: 53 49 53 51  Temp:  97.8 F (36.6 C) 98 F (36.7 C) 98 F (36.7 C)  TempSrc:  Oral Oral Oral  Resp: 20 14 16 16   SpO2: 98% 100% 100% 100%    Wt Readings from Last 3 Encounters:  12/27/14 40.552 kg (89 lb 6.4 oz)  12/16/14 42.094 kg (92 lb 12.8 oz)  12/10/14 43.092 kg (95 lb)      Intake/Output Summary (Last 24 hours) at 12/28/14 1034 Last data filed at 12/28/14 0600  Gross per 24 hour  Intake 701.67 ml  Output    200 ml  Net 501.67 ml     Physical Exam  Awake Alert, Oriented X 3, No new F.N deficits, Normal affect [place].AT,PERRAL Supple Neck,No JVD, No cervical lymphadenopathy appriciated.  Symmetrical Chest wall movement, Good air movement bilaterally, CTAB RRR,No Gallops,Rubs or new Murmurs, No Parasternal Heave +ve B.Sounds, Abd Soft, No tenderness, No organomegaly appriciated, No rebound - guarding or rigidity. No Cyanosis, Clubbing or edema, No new Rash or bruise       Data Review:   Micro Results No results found for this or any previous visit (from the past 240 hour(s)).  Radiology Reports Dg Chest 2 View  12/27/2014   CLINICAL DATA:  Syncope  EXAM: CHEST  2 VIEW  COMPARISON:  None  FINDINGS: The heart size and mediastinal contours are within normal limits. Both lungs are clear. Treated compression fractures noted within the lower thoracic spine.  IMPRESSION: No active cardiopulmonary disease.   Electronically Signed   By: Kerby Moors M.D.   On: 12/27/2014 16:59     CBC  Recent Labs Lab 12/27/14 1725 12/28/14 0126  WBC 8.1 7.3  HGB 13.1 11.7*  HCT 36.8 32.3*  PLT 234 181  MCV 89.3 87.8  MCH 31.8 31.8  MCHC 35.6 36.2*  RDW 15.6* 16.0*  LYMPHSABS 3.8  --   MONOABS 1.0  --   EOSABS 0.2  --   BASOSABS 0.1  --     Chemistries   Recent Labs Lab 12/27/14 1725 12/28/14 0015 12/28/14 0535  NA 141  --  139  K 3.3*  --  3.2*  CL 112*  --  117*  CO2 19*  --  16*  GLUCOSE 89  --  96  BUN 18  --  12  CREATININE 1.23* 1.10* 0.97  CALCIUM 9.3  --  8.4*  MG 2.2  --   --   AST 56*  --   --   ALT 49  --   --   ALKPHOS 40  --   --   BILITOT 0.8  --   --    ------------------------------------------------------------------------------------------------------------------ estimated creatinine clearance is 32.6 mL/min (by C-G  formula based on Cr of 0.97). ------------------------------------------------------------------------------------------------------------------ No results for input(s): HGBA1C in the last 72 hours. ------------------------------------------------------------------------------------------------------------------ No results for input(s): CHOL, HDL, LDLCALC, TRIG, CHOLHDL, LDLDIRECT  in the last 72 hours. ------------------------------------------------------------------------------------------------------------------  Recent Labs  12/27/14 1725  TSH >90.000*  T3FREE 1.4*   ------------------------------------------------------------------------------------------------------------------ No results for input(s): VITAMINB12, FOLATE, FERRITIN, TIBC, IRON, RETICCTPCT in the last 72 hours.  Coagulation profile No results for input(s): INR, PROTIME in the last 168 hours.  No results for input(s): DDIMER in the last 72 hours.  Cardiac Enzymes  Recent Labs Lab 12/27/14 1725 12/28/14 0015 12/28/14 0535  TROPONINI <0.03 <0.03 <0.03   ------------------------------------------------------------------------------------------------------------------ Invalid input(s): POCBNP   Time Spent in minutes   35   Maely Clements K M.D on 12/28/2014 at 10:34 AM  Between 7am to 7pm - Pager - [phone]  After 7pm go to [email or web address] - password [email or web address]  [facility] -  Office  [phone]

## 2014-12-28 NOTE — Progress Notes (Signed)
New Admission Note:  Arrival Method: Via stretcher with [facility] Mental Orientation: Alert &oriented x4 Telemetry: box 14 Assessment: Completed Skin: abrasion on left elbow IV: Left AV Pain: denies any pain Tubes: Safety Measures: Safety Fall Prevention discussed Admission: Completed [address] Orientation: Patient has been orientated to the room, unit and the staff. Family: Husband at bedside  Orders have been reviewed and implemented. Will continue to monitor the patient. Call light has been placed within reach and bed alarm has been activated.   Leandro Reasoner BSN, RN  Phone Number: [phone] [facility]

## 2014-12-28 NOTE — Evaluation (Signed)
Physical Therapy Evaluation Patient Details Name: Ann Gonzales MRN: [id number] DOB: [birth year] Today's Date: 12/28/2014   History of Present Illness  74 yo female with onset of fall at doctor's office, resulting in diagnosis of UTI, bradycardia, FTT, low K+.  Hx:  Stroke, kyphoplasty  Clinical Impression  Pt is having a better day per her estimation but is not eating her meal.  Generally did only walk a short trip due to instability with strong list to L and her difficulty managing walker for turns and clearing obstacles.  Her plan is to get home with husband but recommend short rehab stay as she is still quite weak and not even up to consuming meals yet.  Will have PT work to increase her endurance and LE strength during rest of hospital stay.    Follow Up Recommendations SNF    Equipment Recommendations  None recommended by PT    Recommendations for Other Services       Precautions / Restrictions Precautions Precautions: Fall (telemetry) Restrictions Weight Bearing Restrictions: No      Mobility  Bed Mobility Overal bed mobility: Needs Assistance Bed Mobility: Supine to Sit     Supine to sit: Min assist        Transfers Overall transfer level: Needs assistance Equipment used: Rolling walker (2 wheeled);1 person hand held assist Transfers: Sit to/from [facility] Sit to Stand: Min assist Stand pivot transfers: Min guard;Min assist       General transfer comment: reminders for hand placement and for safety in controlling sitting descent  Ambulation/Gait Ambulation/Gait assistance: Min guard;Min assist Ambulation Distance (Feet): 40 Feet Assistive device: Rolling walker (2 wheeled);1 person hand held assist Gait Pattern/deviations: Step-through pattern;Narrow base of support;Trunk flexed;Drifts right/left Gait velocity: controlled  Gait velocity interpretation: Below normal speed for age/gender    Stairs            Wheelchair Mobility     Modified Rankin (Stroke Patients Only)       Balance Overall balance assessment: Needs assistance Sitting-balance support: Feet supported Sitting balance-Leahy Scale: Fair   Postural control: Posterior lean Standing balance support: Bilateral upper extremity supported Standing balance-Leahy Scale: Poor                               Pertinent Vitals/Pain Pain Assessment: No/denies pain    Home Living Family/patient expects to be discharged to:: Private residence Living Arrangements: Spouse/significant other Available Help at Discharge: Family;Available PRN/intermittently Type of Home: House Home Access: Level entry     Home Layout: One level Home Equipment: Walker - 2 wheels;Cane - single point;Bedside commode;Shower seat      Prior Function Level of Independence: Independent               Hand Dominance        Extremity/Trunk Assessment   Upper Extremity Assessment: Generalized weakness           Lower Extremity Assessment: Generalized weakness      Cervical / Trunk Assessment: Kyphotic  Communication   Communication: No difficulties  Cognition Arousal/Alertness: Awake/alert Behavior During Therapy: WFL for tasks assessed/performed Overall Cognitive Status: Within Functional Limits for tasks assessed                      General Comments General comments (skin integrity, edema, etc.): Pt demonstrates some safety issues that would be best handled with short rehab stay.  Pt is not  wanting to do this but did talk with her about the concern about continual assistance being needed    Exercises        Assessment/Plan    PT Assessment Patient needs continued PT services  PT Diagnosis Generalized weakness   PT Problem List Decreased strength;Decreased range of motion;Decreased activity tolerance;Decreased balance;Decreased mobility;Decreased coordination;Decreased knowledge of use of DME;Decreased safety  awareness;Cardiopulmonary status limiting activity;Decreased skin integrity  PT Treatment Interventions DME instruction;Gait training;Functional mobility training;Therapeutic activities;Therapeutic exercise;Balance training;Neuromuscular re-education;Patient/family education   PT Goals (Current goals can be found in the Care Plan section) Acute Rehab PT Goals Patient Stated Goal: to be home PT Goal Formulation: With patient Time For Goal Achievement: 01/11/15 Potential to Achieve Goals: Good    Frequency Min 2X/week   Barriers to discharge Other (comment) (continual support needed for gait) Pt has not eaten her meal (I don't eat)    Co-evaluation               End of Session   Activity Tolerance: Patient tolerated treatment well;Patient limited by fatigue;Other (comment) (balance losses x 3) Patient left: in chair;with call bell/phone within reach Nurse Communication: Mobility status         Time: 1431-1455 PT Time Calculation (min) (ACUTE ONLY): 24 min   Charges:   PT Evaluation $Initial PT Evaluation Tier I: 1 Procedure PT Treatments $Gait Training: 8-22 mins   PT G Codes:        Ramond Dial 2015-01-20, 3:35 PM   Mee Hives, PT MS Acute Rehab Dept. Number: [facility] [phone] [address] [place] [phone]

## 2014-12-28 NOTE — Assessment & Plan Note (Signed)
Sent to ER after fall for evaluation

## 2014-12-28 NOTE — Progress Notes (Signed)
Initial Nutrition Assessment  DOCUMENTATION CODES:   Severe malnutrition in context of chronic illness, Underweight  INTERVENTION:   Provide Boost Breeze po BID, each supplement provides 250 kcal and 9 grams of protein.  Monitor magnesium, potassium, and phosphorus daily for at least 3 days, MD to replete as needed, as pt is at risk for refeeding syndrome given severe malnutrition and prolonged poor po intake.  RD to continue to monitor.   NUTRITION DIAGNOSIS:   Malnutrition related to chronic illness as evidenced by percent weight loss, energy intake < or equal to 75% for > or equal to 1 month, severe depletion of muscle mass.  GOAL:   Patient will meet greater than or equal to 90% of their needs  MONITOR:   PO intake, Supplement acceptance, Weight trends, Labs, I & O's  REASON FOR ASSESSMENT:   Malnutrition Screening Tool    ASSESSMENT:   74 y.o. female with past medical history of hypothyroidism, hypertension, ulcer arthritis, colitis, rheumatoid arthritis, GERD, anxiety, PUD, migraine headaches, fibromyalgia who was transferred from [facility] after a fall  Current meal completion has been 0%. Pt reports having a decreased appetite which has been ongoing over the past 1 year. Pt reports she has been having abdominal pains, diarrhea after eating which is preventing her to eat adequately. Pt is unable to recall the last time she has had a full meal. Pt is at risk for refeeding syndrome. Pt does report consuming Resource Breeze at home however unable to recall how many she usually drinks daily. Pt reports weight loss. Per Epic weight records, pt with a 15% weight loss in 2 months. RD to order Resource Breeze to aid in caloric and protein needs.   Nutrition-Focused physical exam completed. Findings are moderate fat depletion, moderate to severe muscle depletion, and no edema.   Labs: Low potassium, CO2, calcium, and GFR. High chloride.   Diet Order:  Diet 2 gram sodium Room  service appropriate?: Yes; Fluid consistency:: Thin  Skin:  Reviewed, no issues  Last BM:  8/23  Height:   Ht Readings from Last 1 Encounters:  12/16/14 4\' 11"  (1.499 m)    Weight:   Wt Readings from Last 1 Encounters:  12/27/14 89 lb 6.4 oz (40.552 kg)    Ideal Body Weight:  44.5 kg  BMI:  Body Mass Index: 18.26 kg/(m^2)  Estimated Nutritional Needs:   Kcal:  1350-1550  Protein:  60-70 grams  Fluid:  >/= 1.5 L/day  EDUCATION NEEDS:   No education needs identified at this time  Corrin Parker, MS, RD, LDN Pager # [phone] After hours/ weekend pager # [phone]

## 2014-12-28 NOTE — Assessment & Plan Note (Signed)
Pt went into lab to give urine specimen and fell in bathroom-----  Pt was awake and alert and able to remember she fell but not sure why she fell  She denies dizziness, or blacking out ER called and pt taken to ER on stretcher for further evaluation

## 2014-12-28 NOTE — Progress Notes (Signed)
Notified by CMT that patient's QTc is prolonged at 0.59. Dr. Candiss Norse notified.  Joellen Jersey, RN.

## 2014-12-28 NOTE — Assessment & Plan Note (Signed)
Pt with no appetite and not eating.  Daughter in law states she will go most of the day without eating

## 2014-12-28 NOTE — Progress Notes (Signed)
2D Echocardiogram has been performed.  Ann Gonzales 12/28/2014, 1:07 PM

## 2014-12-29 DIAGNOSIS — E43 Unspecified severe protein-calorie malnutrition: Secondary | ICD-10-CM | POA: Insufficient documentation

## 2014-12-29 LAB — BASIC METABOLIC PANEL
Anion gap: 5 (ref 5–15)
BUN: 7 mg/dL (ref 6–20)
CALCIUM: 8.7 mg/dL — AB (ref 8.9–10.3)
CO2: 16 mmol/L — AB (ref 22–32)
CREATININE: 0.92 mg/dL (ref 0.44–1.00)
Chloride: 118 mmol/L — ABNORMAL HIGH (ref 101–111)
GFR calc non Af Amer: 60 mL/min — ABNORMAL LOW (ref >60)
Glucose, Bld: 84 mg/dL (ref 65–99)
Potassium: 4.9 mmol/L (ref 3.5–5.1)
Sodium: 139 mmol/L (ref 135–145)

## 2014-12-29 LAB — MAGNESIUM: Magnesium: 1.8 mg/dL (ref 1.7–2.4)

## 2014-12-29 LAB — THYROID PEROXIDASE ANTIBODY: THYROID PEROXIDASE ANTIBODY: 325 [IU]/mL — AB (ref 0–34)

## 2014-12-29 LAB — T3, FREE: T3 FREE: 1.1 pg/mL — AB (ref 2.0–4.4)

## 2014-12-29 MED ORDER — LEVOTHYROXINE SODIUM 100 MCG IV SOLR
100.0000 ug | Freq: Every day | INTRAVENOUS | Status: DC
  Administered 2014-12-29 – 2014-12-31 (×3): 100 ug via INTRAVENOUS
  Filled 2014-12-29 (×3): qty 5

## 2014-12-29 MED ORDER — ACETAMINOPHEN 500 MG PO TABS
500.0000 mg | ORAL_TABLET | Freq: Four times a day (QID) | ORAL | Status: DC | PRN
  Administered 2014-12-29 – 2014-12-30 (×3): 500 mg via ORAL
  Filled 2014-12-29 (×3): qty 1

## 2014-12-29 NOTE — Progress Notes (Signed)
Patient Demographics:    Ann Gonzales, is a 74 y.o. female, DOB - [birth year], QAS:[id number]  Admit date - 12/27/2014   Admitting Physician Reubin Milan, MD  Outpatient Primary MD for the patient is Garnet Koyanagi, DO  LOS - 2   Chief Complaint  Patient presents with  . Fall        Subjective:    Ann Gonzales today has, No headache, No chest pain, No abdominal pain - No Nausea, No new weakness tingling or numbness, No Cough - SOB. Does have some generalized weakness and cold intolerance. Feels better today.   Assessment  & Plan :    1. Generalized weakness, fall, cold intolerance, mild bradycardia and lightheadedness secondary to severe hypothyroidism - TSH few months ago was actually low and now over 90. She now has TSH in excess of 90, anti-TPO antibody positive. This is Hashimoto thyroiditis. Placed on IV Synthroid, have discussed her case with endocrinologist Dr. Loanne Drilling.   2. UTI. On Rocephin monitor.   3. Generalized weakness. Due to #1 and #2 above, monitor on telemetry, PT eval, increase activity monitor blood pressure.   4. Bradycardia most likely from #1 above. Blood pressure stable, discontinued beta blocker, treat hypothyroidism as above and monitor.   5. Prolonged QTC. Discontinued SSRIs and all QTC prolonging agents, replace potassium, monitor on telemetry. Magnesium is stable. Echo shows EF of 60% with no acute changes.   6. Hypokalemia . Potassium replaced and now stable along with magnesium levels.   7. Rheumatoid arthritis. Continue hydroxychloroquine for now.    Code Status : Full  Family Communication  : None present  Disposition Plan  : To be decided  Consults  :  Endocrinologist Dr. Loanne Drilling over the phone  Procedures  :    TTE  - Left ventricle:  The cavity size was normal. Wall thickness wasnormal. Systolic function was normal. The estimated ejectionfraction was in the range of 55% to 60%. Wall motion was normal;there were no regional wall motion abnormalities. Dopplerparameters are consistent with abnormal left ventricularrelaxation (grade 1 diastolic dysfunction).  Impressions: - Normal LV systolic function; grade 1 diastolic dysfunction; mildTR  Carotid duplex  - The vertebral arteries appear patent with antegrade flow. Findings consistent with 1-39 percent stenosis involving theright internal carotid artery and the left internal carotid artery. - ICA/CCA ratio. right = 1.11. left = 1.43.   DVT Prophylaxis  :  Lovenox    Lab Results  Component Value Date   PLT 181 12/28/2014    Inpatient Medications  Scheduled Meds: . B-complex with vitamin C  1 tablet Oral Daily  . calcium carbonate  400 mg of elemental calcium Oral TID WC  . cefTRIAXone (ROCEPHIN)  IV  1 g Intravenous Q24H  . cholecalciferol  1,000 Units Oral Daily  . enoxaparin (LOVENOX) injection  20 mg Subcutaneous Q24H  . feeding supplement  1 Container Oral BID BM  . folic acid  1 mg Oral Daily  . hydroxychloroquine  200 mg Oral Daily  . hyoscyamine  0.375 mg Oral BID  . levothyroxine  100 mcg Intravenous Daily  . pantoprazole  40 mg Oral Daily  . potassium chloride  10 mEq Oral BID  . sodium chloride  3 mL Intravenous  Q12H   Continuous Infusions:   PRN Meds:.diazepam  Antibiotics  :     Anti-infectives    Start     Dose/Rate Route Frequency Ordered Stop   12/28/14 1500  cefTRIAXone (ROCEPHIN) 1 g in dextrose 5 % 50 mL IVPB     1 g 100 mL/hr over 30 Minutes Intravenous Every 24 hours 12/27/14 2225     12/28/14 1000  hydroxychloroquine (PLAQUENIL) tablet 200 mg     200 mg Oral Daily 12/27/14 2256     12/27/14 1727  cefTRIAXone (ROCEPHIN) 1 G injection    Comments:  Miguel Rota   : cabinet override      12/27/14 1727 12/27/14 2019    12/27/14 1708  cefTRIAXone (ROCEPHIN) 1 G injection  Status:  Discontinued    Comments:  Vogt, Sarah   : cabinet override      12/27/14 1708 12/27/14 1730   12/27/14 1700  cefTRIAXone (ROCEPHIN) 1 g in dextrose 5 % 50 mL IVPB     1 g 100 mL/hr over 30 Minutes Intravenous  Once 12/27/14 1658 12/27/14 1806        Objective:   Filed Vitals:   12/28/14 1719 12/28/14 2012 12/29/14 0540 12/29/14 0919  BP: 124/69 108/64 115/64 106/61  Pulse: 55 64 63 18  Temp: 98.6 F (37 C) 98.6 F (37 C) 98.3 F (36.8 C) 98.8 F (37.1 C)  TempSrc: Oral Oral Oral Oral  Resp: 18 19 16 18   Weight:  42.3 kg (93 lb 4.1 oz)    SpO2: 98% 100% 99% 100%    Wt Readings from Last 3 Encounters:  12/28/14 42.3 kg (93 lb 4.1 oz)  12/27/14 40.552 kg (89 lb 6.4 oz)  12/16/14 42.094 kg (92 lb 12.8 oz)     Intake/Output Summary (Last 24 hours) at 12/29/14 0931 Last data filed at 12/29/14 0900  Gross per 24 hour  Intake    240 ml  Output    300 ml  Net    -60 ml     Physical Exam  Awake Alert, Oriented X 3, No new F.N deficits, Normal affect [place].AT,PERRAL Supple Neck,No JVD, No cervical lymphadenopathy appriciated.  Symmetrical Chest wall movement, Good air movement bilaterally, CTAB RRR,No Gallops,Rubs or new Murmurs, No Parasternal Heave +ve B.Sounds, Abd Soft, No tenderness, No organomegaly appriciated, No rebound - guarding or rigidity. No Cyanosis, Clubbing or edema, No new Rash or bruise       Data Review:   Micro Results Recent Results (from the past 240 hour(s))  Urine culture     Status: None (Preliminary result)   Collection Time: 12/27/14  5:00 PM  Result Value Ref Range Status   Specimen Description URINE, CLEAN CATCH  Final   Special Requests NONE  Final   Culture   Final    CULTURE REINCUBATED FOR BETTER GROWTH Performed at [facility]    Report Status PENDING  Incomplete    Radiology Reports Dg Chest 2 View  12/27/2014   CLINICAL DATA:  Syncope  EXAM: CHEST  2 VIEW   COMPARISON:  None  FINDINGS: The heart size and mediastinal contours are within normal limits. Both lungs are clear. Treated compression fractures noted within the lower thoracic spine.  IMPRESSION: No active cardiopulmonary disease.   Electronically Signed   By: Kerby Moors M.D.   On: 12/27/2014 16:59     CBC  Recent Labs Lab 12/27/14 1725 12/28/14 0126  WBC 8.1 7.3  HGB 13.1 11.7*  HCT 36.8 32.3*  PLT 234 181  MCV 89.3 87.8  MCH 31.8 31.8  MCHC 35.6 36.2*  RDW 15.6* 16.0*  LYMPHSABS 3.8  --   MONOABS 1.0  --   EOSABS 0.2  --   BASOSABS 0.1  --     Chemistries   Recent Labs Lab 12/27/14 1725 12/28/14 0015 12/28/14 0535 12/29/14 0534  NA 141  --  139 139  K 3.3*  --  3.2* 4.9  CL 112*  --  117* 118*  CO2 19*  --  16* 16*  GLUCOSE 89  --  96 84  BUN 18  --  12 7  CREATININE 1.23* 1.10* 0.97 0.92  CALCIUM 9.3  --  8.4* 8.7*  MG 2.2  --   --  1.8  AST 56*  --   --   --   ALT 49  --   --   --   ALKPHOS 40  --   --   --   BILITOT 0.8  --   --   --    ------------------------------------------------------------------------------------------------------------------ estimated creatinine clearance is 35.8 mL/min (by C-G formula based on Cr of 0.92). ------------------------------------------------------------------------------------------------------------------ No results for input(s): HGBA1C in the last 72 hours. ------------------------------------------------------------------------------------------------------------------ No results for input(s): CHOL, HDL, LDLCALC, TRIG, CHOLHDL, LDLDIRECT in the last 72 hours. ------------------------------------------------------------------------------------------------------------------  Recent Labs  12/27/14 1725 12/28/14 0953  TSH >90.000*  --   T3FREE 1.4* 1.1*   ------------------------------------------------------------------------------------------------------------------ No results for input(s): VITAMINB12,  FOLATE, FERRITIN, TIBC, IRON, RETICCTPCT in the last 72 hours.  Coagulation profile No results for input(s): INR, PROTIME in the last 168 hours.  No results for input(s): DDIMER in the last 72 hours.  Cardiac Enzymes  Recent Labs Lab 12/27/14 1725 12/28/14 0015 12/28/14 0535  TROPONINI <0.03 <0.03 <0.03   ------------------------------------------------------------------------------------------------------------------ Invalid input(s): POCBNP   Time Spent in minutes   35   SINGH,PRASHANT K M.D on 12/29/2014 at 9:31 AM  Between 7am to 7pm - Pager - [phone]  After 7pm go to [email or web address] - password [facility]  [facility] -  Office  [phone]

## 2014-12-29 NOTE — Clinical Social Work Note (Signed)
CSW talked with patient regarding consult for SNF placement. Mrs. Bulls informed CSW that she has a b'day party for her husband already scheduled this weekend [place]. Patient stated that she will not be doing anything in terms of setting up, cooking, etc., however the party is already scheduled, guests invited, etc. Sheldon talked with patient about home health after consulting nurse case manager.  CSW signing off as patient will be going home at discharge. Please reconsult if any other SW services needed.    Eva Griffo Givens, MSW, LCSW Licensed Clinical Social Worker [place] [phone]

## 2014-12-30 ENCOUNTER — Ambulatory Visit: Payer: Medicare Other | Admitting: Family Medicine

## 2014-12-30 LAB — TSH

## 2014-12-30 LAB — URINE CULTURE: Culture: >=100000

## 2014-12-30 MED ORDER — PROMETHAZINE HCL 25 MG/ML IJ SOLN: 12.5000 mg | Freq: Once | INTRAMUSCULAR | Status: DC

## 2014-12-30 MED ORDER — DRONABINOL 2.5 MG PO CAPS
2.5000 mg | ORAL_CAPSULE | Freq: Two times a day (BID) | ORAL | Status: DC
  Administered 2014-12-30 – 2014-12-31 (×2): 2.5 mg via ORAL
  Filled 2014-12-30 (×2): qty 1

## 2014-12-30 MED ORDER — FOSFOMYCIN TROMETHAMINE 3 G PO PACK
3.0000 g | PACK | Freq: Once | ORAL | Status: AC
  Administered 2014-12-30: 3 g via ORAL
  Filled 2014-12-30: qty 3

## 2014-12-30 MED ORDER — DIPHENHYDRAMINE HCL 50 MG/ML IJ SOLN: 12.5000 mg | Freq: Once | INTRAMUSCULAR | Status: DC

## 2014-12-30 MED ORDER — KETOROLAC TROMETHAMINE 30 MG/ML IJ SOLN
30.0000 mg | Freq: Once | INTRAMUSCULAR | Status: AC
  Administered 2014-12-30: 30 mg via INTRAVENOUS
  Filled 2014-12-30: qty 1

## 2014-12-30 MED ORDER — BOOST / RESOURCE BREEZE PO LIQD
1.0000 | Freq: Three times a day (TID) | ORAL | Status: DC
  Administered 2014-12-31: 1 via ORAL

## 2014-12-30 NOTE — Progress Notes (Signed)
Patient c/o sinus pressure and congestion. Dr. Candiss Norse notified.  Joellen Jersey, RN.

## 2014-12-30 NOTE — Progress Notes (Signed)
Patient Demographics:    Ann Gonzales, is a 74 y.o. female, DOB - 05/01/1941, UJW:[id number]  Admit date - 12/27/2014   Admitting Physician Reubin Milan, MD  Outpatient Primary MD for the patient is Garnet Koyanagi, DO  LOS - 3   Chief Complaint  Patient presents with  . Fall        Subjective:    [name] today has, No headache, No chest pain, No abdominal pain - No Nausea, No new weakness tingling or numbness, No Cough - SOB. Does have some generalized weakness and cold intolerance. Feels better today.   Assessment  & Plan :    1. Generalized weakness, fall, cold intolerance, mild bradycardia and lightheadedness secondary to severe hypothyroidism - TSH few months ago was actually low and now over 90. She now has TSH in excess of 90, anti-TPO antibody positive. This is Hashimoto thyroiditis. Placed on IV Synthroid, have discussed her case with endocrinologist Dr. Loanne Drilling agrees with the plan, repeat TSH likely discharge in the morning.    2. UTI. On Rocephin and improved.   3. Generalized weakness. Due to #1 and #2 above, monitor on telemetry, PT eval, increase activity monitor blood pressure. She refused SNF and home PT.   4. Bradycardia most likely from #1 above. Blood pressure stable, discontinued beta blocker, treat hypothyroidism as above and monitor.   5. Prolonged QTC. Discontinued SSRIs and all QTC prolonging agents, replace potassium, monitor on telemetry. Magnesium is stable. Echo shows EF of 60% with no acute changes.   6. Hypokalemia . Potassium replaced and now stable along with magnesium levels.   7. Rheumatoid arthritis. Continue hydroxychloroquine for now.   8. Chronic diarrhea. Has had extensive outpatient workup with Dr. Ardis Hughs, will follow with him post  discharge as well.    Code Status : Full  Family Communication  : None present  Disposition Plan  : Home in the morning  Consults  :  Endocrinologist Dr. Loanne Drilling over the phone  Procedures  :    TTE  - Left ventricle: The cavity size was normal. Wall thickness wasnormal. Systolic function was normal. The estimated ejectionfraction was in the range of 55% to 60%. Wall motion was normal;there were no regional wall motion abnormalities. Dopplerparameters are consistent with abnormal left ventricularrelaxation (grade 1 diastolic dysfunction).  Impressions: - Normal LV systolic function; grade 1 diastolic dysfunction; mildTR  Carotid duplex  - The vertebral arteries appear patent with antegrade flow. Findings consistent with 1-39 percent stenosis involving theright internal carotid artery and the left internal carotidartery.  - ICA/CCA ratio. right = 1.11. left = 1.43.   DVT Prophylaxis  :  Lovenox    Lab Results  Component Value Date   PLT 181 12/28/2014    Inpatient Medications  Scheduled Meds: . B-complex with vitamin C  1 tablet Oral Daily  . calcium carbonate  400 mg of elemental calcium Oral TID WC  . cefTRIAXone (ROCEPHIN)  IV  1 g Intravenous Q24H  . cholecalciferol  1,000 Units Oral Daily  . enoxaparin (LOVENOX) injection  20 mg Subcutaneous Q24H  . feeding supplement  1 Container Oral BID BM  . folic acid  1 mg Oral Daily  . hydroxychloroquine  200 mg Oral Daily  .  hyoscyamine  0.375 mg Oral BID  . levothyroxine  100 mcg Intravenous Daily  . pantoprazole  40 mg Oral Daily  . potassium chloride  10 mEq Oral BID  . sodium chloride  3 mL Intravenous Q12H   Continuous Infusions:   PRN Meds:.acetaminophen, diazepam  Antibiotics  :     Anti-infectives    Start     Dose/Rate Route Frequency Ordered Stop   12/28/14 1500  cefTRIAXone (ROCEPHIN) 1 g in dextrose 5 % 50 mL IVPB     1 g 100 mL/hr over 30 Minutes Intravenous Every 24 hours 12/27/14 2225       12/28/14 1000  hydroxychloroquine (PLAQUENIL) tablet 200 mg     200 mg Oral Daily 12/27/14 2256     12/27/14 1727  cefTRIAXone (ROCEPHIN) 1 G injection    Comments:  Miguel Rota   : cabinet override      12/27/14 1727 12/27/14 2019   12/27/14 1708  cefTRIAXone (ROCEPHIN) 1 G injection  Status:  Discontinued    Comments:  Vogt, Sarah   : cabinet override      12/27/14 1708 12/27/14 1730   12/27/14 1700  cefTRIAXone (ROCEPHIN) 1 g in dextrose 5 % 50 mL IVPB     1 g 100 mL/hr over 30 Minutes Intravenous  Once 12/27/14 1658 12/27/14 1806        Objective:   Filed Vitals:   12/29/14 1711 12/29/14 2015 12/30/14 0432 12/30/14 0812  BP: 112/60 140/86 132/85 125/69  Pulse: 68 67 81 72  Temp: 98.5 F (36.9 C) 98.3 F (36.8 C) 98.1 F (36.7 C) 98.4 F (36.9 C)  TempSrc: Oral Oral Oral Oral  Resp: 16 17 19 17   Height:      Weight:  44.2 kg (97 lb 7.1 oz)    SpO2: 100% 97% 98% 100%    Wt Readings from Last 3 Encounters:  12/29/14 44.2 kg (97 lb 7.1 oz)  12/27/14 40.552 kg (89 lb 6.4 oz)  12/16/14 42.094 kg (92 lb 12.8 oz)     Intake/Output Summary (Last 24 hours) at 12/30/14 0824 Last data filed at 12/30/14 4315  Gross per 24 hour  Intake    300 ml  Output    800 ml  Net   -500 ml     Physical Exam  Awake Alert, Oriented X 3, No new F.N deficits, Normal affect [place].AT,PERRAL Supple Neck,No JVD, No cervical lymphadenopathy appriciated.  Symmetrical Chest wall movement, Good air movement bilaterally, CTAB RRR,No Gallops,Rubs or new Murmurs, No Parasternal Heave +ve B.Sounds, Abd Soft, No tenderness, No organomegaly appriciated, No rebound - guarding or rigidity. No Cyanosis, Clubbing or edema, No new Rash or bruise       Data Review:   Micro Results Recent Results (from the past 240 hour(s))  Urine culture     Status: None (Preliminary result)   Collection Time: 12/27/14  5:00 PM  Result Value Ref Range Status   Specimen Description URINE, CLEAN CATCH  Final    Special Requests NONE  Final   Culture   Final    >=100,000 COLONIES/mL KLEBSIELLA PNEUMONIAE Performed at [facility]    Report Status PENDING  Incomplete    Radiology Reports Dg Chest 2 View  12/27/2014   CLINICAL DATA:  Syncope  EXAM: CHEST  2 VIEW  COMPARISON:  None  FINDINGS: The heart size and mediastinal contours are within normal limits. Both lungs are clear. Treated compression fractures noted within the lower thoracic  spine.  IMPRESSION: No active cardiopulmonary disease.   Electronically Signed   By: Kerby Moors M.D.   On: 12/27/2014 16:59     CBC  Recent Labs Lab 12/27/14 1725 12/28/14 0126  WBC 8.1 7.3  HGB 13.1 11.7*  HCT 36.8 32.3*  PLT 234 181  MCV 89.3 87.8  MCH 31.8 31.8  MCHC 35.6 36.2*  RDW 15.6* 16.0*  LYMPHSABS 3.8  --   MONOABS 1.0  --   EOSABS 0.2  --   BASOSABS 0.1  --     Chemistries   Recent Labs Lab 12/27/14 1725 12/28/14 0015 12/28/14 0535 12/29/14 0534  NA 141  --  139 139  K 3.3*  --  3.2* 4.9  CL 112*  --  117* 118*  CO2 19*  --  16* 16*  GLUCOSE 89  --  96 84  BUN 18  --  12 7  CREATININE 1.23* 1.10* 0.97 0.92  CALCIUM 9.3  --  8.4* 8.7*  MG 2.2  --   --  1.8  AST 56*  --   --   --   ALT 49  --   --   --   ALKPHOS 40  --   --   --   BILITOT 0.8  --   --   --    ------------------------------------------------------------------------------------------------------------------ estimated creatinine clearance is 36.6 mL/min (by C-G formula based on Cr of 0.92). ------------------------------------------------------------------------------------------------------------------ No results for input(s): HGBA1C in the last 72 hours. ------------------------------------------------------------------------------------------------------------------ No results for input(s): CHOL, HDL, LDLCALC, TRIG, CHOLHDL, LDLDIRECT in the last 72  hours. ------------------------------------------------------------------------------------------------------------------  Recent Labs  12/27/14 1725 12/28/14 0953  TSH >90.000*  --   T3FREE 1.4* 1.1*   ------------------------------------------------------------------------------------------------------------------ No results for input(s): VITAMINB12, FOLATE, FERRITIN, TIBC, IRON, RETICCTPCT in the last 72 hours.  Coagulation profile No results for input(s): INR, PROTIME in the last 168 hours.  No results for input(s): DDIMER in the last 72 hours.  Cardiac Enzymes  Recent Labs Lab 12/27/14 1725 12/28/14 0015 12/28/14 0535  TROPONINI <0.03 <0.03 <0.03   ------------------------------------------------------------------------------------------------------------------ Invalid input(s): POCBNP   Time Spent in minutes   35   Shaqueta Casady K M.D on 12/30/2014 at 8:24 AM  Between 7am to 7pm - Pager - [phone]  After 7pm go to [email or web address] - password [facility]  [facility] -  Office  [phone]

## 2014-12-30 NOTE — Progress Notes (Signed)
Received call from Arbie Cookey, RN in infection prevention stating that patient's urine culture was positive for klebsiella and ESBL. Dr. Candiss Norse notified. Patient placed on orange contact precautions per protocol. Education handouts given to patient.  Joellen Jersey, RN.

## 2014-12-30 NOTE — Progress Notes (Signed)
Nutrition Follow-up  DOCUMENTATION CODES:   Severe malnutrition in context of chronic illness, Underweight  INTERVENTION:   Provide Boost Breeze po TID, each supplement provides 250 kcal and 9 grams of protein.  Encourage adequate PO intake.  NUTRITION DIAGNOSIS:   Malnutrition related to chronic illness as evidenced by percent weight loss, energy intake < or equal to 75% for > or equal to 1 month, severe depletion of muscle mass; ongoing  GOAL:   Patient will meet greater than or equal to 90% of their needs; progressing  MONITOR:   PO intake, Supplement acceptance, Weight trends, Labs, I & O's  REASON FOR ASSESSMENT:   Malnutrition Screening Tool    ASSESSMENT:   74 y.o. female with past medical history of hypothyroidism, hypertension, ulcer arthritis, colitis, rheumatoid arthritis, GERD, anxiety, PUD, migraine headaches, fibromyalgia who was transferred from [facility] after a fall  Meal completion has been 50%. Intake has been improving. Pt has been consuming her Boost Breeze. RD to modify to TID to aid in caloric and protein needs. Pt encouraged to eat her food at meals and to drink her supplements.   Magnesium and potassium WNL.  Diet Order:  Diet 2 gram sodium Room service appropriate?: Yes; Fluid consistency:: Thin  Skin:  Reviewed, no issues  Last BM:  8/25  Height:   Ht Readings from Last 1 Encounters:  12/29/14 4\' 11"  (1.499 m)    Weight:   Wt Readings from Last 1 Encounters:  12/29/14 97 lb 7.1 oz (44.2 kg)    Ideal Body Weight:  44.5 kg  BMI:  Body mass index is 19.67 kg/(m^2).  Estimated Nutritional Needs:   Kcal:  1350-1550  Protein:  60-70 grams  Fluid:  >/= 1.5 L/day  EDUCATION NEEDS:   No education needs identified at this time  Corrin Parker, MS, RD, LDN Pager # [phone] After hours/ weekend pager # [phone]

## 2014-12-31 ENCOUNTER — Telehealth: Payer: Self-pay | Admitting: Family Medicine

## 2014-12-31 MED ORDER — BOOST / RESOURCE BREEZE PO LIQD: 1.0000 | Freq: Three times a day (TID) | ORAL | Status: AC

## 2014-12-31 MED ORDER — LEVOTHYROXINE SODIUM 75 MCG PO TABS: 75.0000 ug | ORAL_TABLET | Freq: Every day | ORAL | Status: AC

## 2014-12-31 NOTE — Telephone Encounter (Signed)
I tried calling [facility] but the Vm is not setup.     KP

## 2014-12-31 NOTE — Discharge Summary (Signed)
Ann Gonzales, is a 74 y.o. female  DOB [birth year]  MRN [id number].  Admission date:  12/27/2014  Admitting Physician  Reubin Milan, MD  Discharge Date:  12/31/2014   Primary MD  Garnet Koyanagi, DO  Recommendations for primary care physician for things to follow:   Check EKG for QTC, CBC, BMP, magnesium and TSH levels in 5-7 days   Admission Diagnosis  Bradycardia [R00.1] Renal insufficiency [N28.9] Acute cystitis without hematuria [N30.00]   Discharge Diagnosis  Bradycardia [R00.1] Renal insufficiency [N28.9] Acute cystitis without hematuria [N30.00]     Principal Problem:   Symptomatic bradycardia Active Problems:   Dehydration   Hypothyroidism   Rheumatoid arthritis   FTT (failure to thrive) in adult   Hypokalemia   Depression   UTI (urinary tract infection)   Protein-calorie malnutrition, severe      Past Medical History  Diagnosis Date  . Hypothyroidism   . Hypertension   . Osteoarthritis   . Colitis   . Rheumatoid arthritis(714.0)   . GERD (gastroesophageal reflux disease)   . Anxiety   . History of stomach ulcers   . Migraine     "sometimes 2-3 times/month" (09/02/2014)  . Stroke     "I think I had a mild stroke years ago" (09/02/2014)  . Fibromyalgia   . Chronic lower back pain   . Kidney stones   . Squamous cell carcinoma of nose   . Vocal cord paralysis   . Helicobacter pylori (H. pylori) infection     Past Surgical History  Procedure Laterality Date  . Tonsillectomy    . Abdominal hysterectomy    . Cesarean section  1960's X 1  . Laparoscopic cholecystectomy    . Excisional hemorrhoidectomy    . Lumbar disc surgery  X 2    "lower back OR; don't know what they did"  . Cataract extraction w/ intraocular lens  implant, bilateral Bilateral   . Lithotripsy    . Squamous  cell carcinoma excision      "nose"  . Kyphoplasty      Dr.Brown       HPI  from the history and physical done on the day of admission:    Ann Gonzales is a 74 y.o. female with past medical history of hypothyroidism, hypertension, ulcer arthritis, colitis, rheumatoid arthritis, GERD, anxiety, PUD, migraine headaches, fibromyalgia, and other conditions below who was transferred from [facility] after a fall at that facility when she went to see her primary care doctor. Per patient, she just felt dizzy and weak. She denies loss of consciousness. She denies chest pain, palpitations, diaphoresis, nausea or dyspnea. She denies PND, orthopnea or pitting edema of the lower extremities. She does not have a previous cardiac history, but has been very weak ever since she has been having colitis with chronic diarrhea.      Hospital Course:     1. Generalized weakness, fall, cold intolerance, mild bradycardia and lightheadedness secondary to severe hypothyroidism - TSH few months ago was actually low and now  was over 90. Her anti-TPO antibody was positive. 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 I discussed her case with endocrinologist Dr. Loanne Drilling agreed with initial IV Synthroid followed by oral Synthroid which was increased from 25 g daily to 75 g daily, will request PCP to monitor TSH closely and I have scheduled her to follow with Dr. Renato Shin endocrinologist within a week as well.  Her bradycardia has resolved, blood pressure stable, her weakness is improved. We'll continue to her beta blocker upon discharge.   2. UTI. With ESBL Escherichia coli treated with fosfomycin.   3. Generalized weakness. Due to #1 and #2 above, monitor on telemetry, PT eval, increase activity monitor blood pressure.   4. Bradycardia most likely from #1 above. Blood pressure stable, discontinued beta blocker, treat hypothyroidism as above and monitor. Bradycardia resolved and blood pressure is stable.   5.  Prolonged QTC. In the presence of hypokalemia and low magnesium levels, both were replaced, SSRI was held here, QTC stabilized, SSRI resumed upon discharge will request PCP to recheck EKG in 5-7 days to keep an eye on QTC levels.   6. Hypokalemia . Potassium replaced and now stable along with magnesium levels.   7. Rheumatoid arthritis. Continue home regimen upon discharge.   8. Moderate to severe protein calorie malnutrition. Placed on protein supplementation.   9. Chronic diarrhea she follows with Dr. Ardis Hughs, requested to follow with him and consider outpatient colonoscopy.    Discharge Condition: Stable  Follow UP  Follow-up Information    Follow up with Garnet Koyanagi, DO. Schedule an appointment as soon as possible for a visit in 1 week.   Specialty:  Family Medicine   Contact information:   [address] [place] [address] [phone]       Follow up with Renato Shin, MD. Schedule an appointment as soon as possible for a visit in 1 week.   Specialty:  Endocrinology   Why:  Hashimoto   Contact information:   [address] [place] [address] [phone]       Follow up with Milus Banister, MD. Schedule an appointment as soon as possible for a visit in 1 week.   Specialty:  Gastroenterology   Why:  chronic diarrhea   Contact information:   [address] [place] [address] [phone]        Consults obtained - endocrinologist Dr. Franco Nones over the phone  Diet and Activity recommendation: See Discharge Instructions below  Discharge Instructions      Discharge Instructions    Diet - low sodium heart healthy    Complete by:  As directed      Discharge instructions    Complete by:  As directed   Follow with Primary MD Garnet Koyanagi, DO in 7 days   Get CBC, CMP, TSH ,2 view Chest X ray checked  by Primary MD next visit.    Activity: As tolerated with Full fall precautions use walker/cane & assistance as  needed   Disposition Home     Diet: Heart Healthy  .  For Heart failure patients - Check your Weight same time everyday, if you gain over 2 pounds, or you develop in leg swelling, experience more shortness of breath or chest pain, call your Primary MD immediately. Follow Cardiac Low Salt Diet and 1.5 lit/day fluid restriction.   On your next visit with your primary care physician please Get Medicines reviewed and adjusted.   Please  request your Prim.MD to go over all Hospital Tests and Procedure/Radiological results at the follow up, please get all Hospital records sent to your Prim MD by signing hospital release before you go home.   If you experience worsening of your admission symptoms, develop shortness of breath, life threatening emergency, suicidal or homicidal thoughts you must seek medical attention immediately by calling 911 or calling your MD immediately  if symptoms less severe.  You Must read complete instructions/literature along with all the possible adverse reactions/side effects for all the Medicines you take and that have been prescribed to you. Take any new Medicines after you have completely understood and accpet all the possible adverse reactions/side effects.   Do not drive, operating heavy machinery, perform activities at heights, swimming or participation in water activities or provide baby sitting services if your were admitted for syncope or siezures until you have seen by Primary MD or a Neurologist and advised to do so again.  Do not drive when taking Pain medications.    Do not take more than prescribed Pain, Sleep and Anxiety Medications  Special Instructions: If you have smoked or chewed Tobacco  in the last 2 yrs please stop smoking, stop any regular Alcohol  and or any Recreational drug use.  Wear Seat belts while driving.   Please note  You were cared for by a hospitalist during your hospital stay. If you have any questions about your discharge  medications or the care you received while you were in the hospital after you are discharged, you can call the unit and asked to speak with the hospitalist on call if the hospitalist that took care of you is not available. Once you are discharged, your primary care physician will handle any further medical issues. Please note that NO REFILLS for any discharge medications will be authorized once you are discharged, as it is imperative that you return to your primary care physician (or establish a relationship with a primary care physician if you do not have one) for your aftercare needs so that they can reassess your need for medications and monitor your lab values.     Increase activity slowly    Complete by:  As directed              Discharge Medications       Medication List    STOP taking these medications        metoprolol succinate 100 MG 24 hr tablet  Commonly known as:  TOPROL XL      TAKE these medications        acetaminophen 500 MG tablet  Commonly known as:  TYLENOL  Take 1,000 mg by mouth 2 (two) times daily.     B-complex with vitamin C tablet  Take 1 tablet by mouth at bedtime.     BOOST BREEZE Liqd  530 CAL / BOTTLE--DRINK 3 A DAY BETWEEN MEALS AND BETWEEN DINNER AND BEDTIME     feeding supplement Liqd  Take 1 Container by mouth 3 (three) times daily between meals.     cephALEXin 250 MG capsule  Commonly known as:  KEFLEX  Take 250 mg by mouth at bedtime.     cholecalciferol 1000 UNITS tablet  Commonly known as:  VITAMIN D  Take 1,000 Units by mouth at bedtime.     [employer] B-1 100 MG tablet  Generic drug:  thiamine  Take 100 mg by mouth at bedtime.     diazepam 5 MG tablet  Commonly  known as:  VALIUM  Take 2.5 mg by mouth at bedtime.     etanercept 50 MG/ML injection  Commonly known as:  ENBREL  Inject 50 mg into the skin once a week.     folic acid 1 MG tablet  Commonly known as:  FOLVITE  Take 1 tablet (1 mg total) by mouth daily.      hydroxychloroquine 200 MG tablet  Commonly known as:  PLAQUENIL  TAKE 1 TABLET (200 MG TOTAL) BY MOUTH DAILY.     hyoscyamine 0.375 MG 12 hr tablet  Commonly known as:  LEVBID  Take 1 tablet (0.375 mg total) by mouth 2 (two) times daily.     levothyroxine 75 MCG tablet  Commonly known as:  SYNTHROID, LEVOTHROID  Take 1 tablet (75 mcg total) by mouth daily before breakfast.     mirtazapine 15 MG tablet  Commonly known as:  REMERON  Take 15 mg by mouth at bedtime.     ondansetron 4 MG tablet  Commonly known as:  ZOFRAN  Take 1 tablet (4 mg total) by mouth every 8 (eight) hours as needed for nausea or vomiting.     pantoprazole 40 MG tablet  Commonly known as:  PROTONIX  Take 40 mg by mouth at bedtime.     potassium chloride 10 MEQ CR capsule  Commonly known as:  MICRO-K  Take 10 mEq by mouth daily.     potassium chloride 10 MEQ tablet  Commonly known as:  K-DUR  Take 1 tablet (10 mEq total) by mouth once.     sertraline 50 MG tablet  Commonly known as:  ZOLOFT  Take 50 mg by mouth daily.        Major procedures and Radiology Reports - PLEASE review detailed and final reports for all details, in brief -       Dg Chest 2 View  12/27/2014   CLINICAL DATA:  Syncope  EXAM: CHEST  2 VIEW  COMPARISON:  None  FINDINGS: The heart size and mediastinal contours are within normal limits. Both lungs are clear. Treated compression fractures noted within the lower thoracic spine.  IMPRESSION: No active cardiopulmonary disease.   Electronically Signed   By: Kerby Moors M.D.   On: 12/27/2014 16:59    Micro Results      Recent Results (from the past 240 hour(s))  Urine culture     Status: None   Collection Time: 12/27/14  5:00 PM  Result Value Ref Range Status   Specimen Description URINE, CLEAN CATCH  Final   Special Requests NONE  Final   Culture   Final    >=100,000 COLONIES/mL KLEBSIELLA PNEUMONIAE Confirmed Extended Spectrum Beta-Lactamase Producer (ESBL) Performed at  [facility]    Report Status 12/30/2014 FINAL  Final   Organism ID, Bacteria KLEBSIELLA PNEUMONIAE  Final      Susceptibility   Klebsiella pneumoniae - MIC*    AMPICILLIN >=32 RESISTANT Resistant     CEFAZOLIN >=64 RESISTANT Resistant     CEFTRIAXONE >=64 RESISTANT Resistant     CIPROFLOXACIN <=0.25 SENSITIVE Sensitive     GENTAMICIN <=1 SENSITIVE Sensitive     IMIPENEM <=0.25 SENSITIVE Sensitive     NITROFURANTOIN 128 RESISTANT Resistant     TRIMETH/SULFA <=20 SENSITIVE Sensitive     AMPICILLIN/SULBACTAM >=32 RESISTANT Resistant     PIP/TAZO >=128 RESISTANT Resistant     * >=100,000 COLONIES/mL KLEBSIELLA PNEUMONIAE       Today   Subjective  Vinaya Sancho today has no headache,no chest abdominal pain,no new weakness tingling or numbness, feels much better wants to go home today.     Objective   Blood pressure 112/68, pulse 78, temperature 98.1 F (36.7 C), temperature source Oral, resp. rate 16, height 4\' 11"  (1.499 m), weight 44.2 kg (97 lb 7.1 oz), SpO2 100 %.   Intake/Output Summary (Last 24 hours) at 12/31/14 0758 Last data filed at 12/31/14 0500  Gross per 24 hour  Intake    720 ml  Output   1000 ml  Net   -280 ml    Exam Awake Alert, Oriented x 3, No new F.N deficits, Normal affect [place].AT,PERRAL Supple Neck,No JVD, No cervical lymphadenopathy appriciated.  Symmetrical Chest wall movement, Good air movement bilaterally, CTAB RRR,No Gallops,Rubs or new Murmurs, No Parasternal Heave +ve B.Sounds, Abd Soft, Non tender, No organomegaly appriciated, No rebound -guarding or rigidity. No Cyanosis, Clubbing or edema, No new Rash or bruise   Data Review   CBC w Diff: Lab Results  Component Value Date   WBC 7.3 12/28/2014   HGB 11.7* 12/28/2014   HCT 32.3* 12/28/2014   PLT 181 12/28/2014   LYMPHOPCT 46 12/27/2014   MONOPCT 13* 12/27/2014   EOSPCT 3 12/27/2014   BASOPCT 1 12/27/2014    CMP: Lab Results  Component Value Date   NA 139 12/29/2014     K 4.9 12/29/2014   CL 118* 12/29/2014   CO2 16* 12/29/2014   BUN 7 12/29/2014   CREATININE 0.92 12/29/2014   PROT 7.8 12/27/2014   ALBUMIN 4.2 12/27/2014   BILITOT 0.8 12/27/2014   ALKPHOS 40 12/27/2014   AST 56* 12/27/2014   ALT 49 12/27/2014  .   Total Time in preparing paper work, data evaluation and todays exam - 35 minutes  Thurnell Lose M.D on 12/31/2014 at 7:58 AM  [facility]   Office  [phone]

## 2014-12-31 NOTE — Progress Notes (Signed)
Physical Therapy Treatment Patient Details Name: Ann Gonzales MRN: [id number] DOB: 13-Aug-1940 Today's Date: 12/31/2014    History of Present Illness 74 yo female with onset of fall at doctor's office, resulting in diagnosis of UTI, bradycardia, FTT, low K+.  Hx:  Stroke, kyphoplasty    PT Comments    Patient making progress with mobility and gait.  However continues to demonstrate decreased balance with gait.  Patient declining any f/u PT at discharge.  Stressed to patient importance of having physical assist when ambulating at home for safety.   Follow Up Recommendations  Supervision/Assistance - 24 hour;SNF;Home health PT (Patient declining f/u PT at SNF and HHPT.)     Equipment Recommendations  None recommended by PT    Recommendations for Other Services       Precautions / Restrictions Precautions Precautions: Fall Restrictions Weight Bearing Restrictions: No    Mobility  Bed Mobility Overal bed mobility: Modified Independent Bed Mobility: Supine to Sit;Sit to Supine     Supine to sit: Modified independent (Device/Increase time) Sit to supine: Modified independent (Device/Increase time)   General bed mobility comments: Increased time.  HOB elevated  Transfers Overall transfer level: Needs assistance Equipment used: Rolling walker (2 wheeled) Transfers: Sit to/from Stand Sit to Stand: Min assist         General transfer comment: Verbal cues for hand placement.  Patient pushing into extension at trunk making feet slide. Patient with posterior lean initially in stance.  Practiced transfer x3. Cues to move trunk forward over feet to assist with balance during transfer.  Able to perform with min guard assist on 3rd attempt.  Ambulation/Gait Ambulation/Gait assistance: Min guard;Min assist Ambulation Distance (Feet): 60 Feet Assistive device: Rolling walker (2 wheeled) Gait Pattern/deviations: Step-through pattern;Decreased step length - right;Decreased step  length - left;Decreased stride length;Shuffle;Narrow base of support     General Gait Details: Verbal cues for safe use of RW and to move at more safe speed.  Patient with occasional loss of balance, especially during turns, requiring assist.  Instructed patient to have assist during gait at home.   Stairs            Wheelchair Mobility    Modified Rankin (Stroke Patients Only)       Balance Overall balance assessment: Needs assistance         Standing balance support: Bilateral upper extremity supported Standing balance-Leahy Scale: Poor                      Cognition Arousal/Alertness: Awake/alert Behavior During Therapy: WFL for tasks assessed/performed Overall Cognitive Status: Within Functional Limits for tasks assessed                      Exercises      General Comments        Pertinent Vitals/Pain Pain Assessment: No/denies pain    Home Living                      Prior Function            PT Goals (current goals can now be found in the care plan section) Progress towards PT goals: Progressing toward goals    Frequency  Min 2X/week    PT Plan Discharge plan needs to be updated    Co-evaluation             End of Session Equipment Utilized During Treatment: Gait belt Activity Tolerance: Patient  tolerated treatment well Patient left: in bed;with call bell/phone within reach     Time: 1147-1157 PT Time Calculation (min) (ACUTE ONLY): 10 min  Charges:  $Gait Training: 8-22 mins                    G Codes:      Despina Pole 01-15-15, 12:45 PM Carita Pian. Sanjuana Kava, [place] Pager [phone]

## 2014-12-31 NOTE — Progress Notes (Signed)
Patient to be discharged to home with spouse. PIV and telemetry box #14 removed. Discharge instructions including follow up appts and new medications reviewed with patient and patient's spouse. Prescriptions given to patient's spouse.   Joellen Jersey, RN.

## 2014-12-31 NOTE — Discharge Instructions (Signed)
Follow with Primary MD Garnet Koyanagi, DO in 7 days   Get CBC, CMP, TSH ,2 view Chest X ray checked  by Primary MD next visit.    Activity: As tolerated with Full fall precautions use walker/cane & assistance as needed   Disposition Home     Diet: Heart Healthy  .  For Heart failure patients - Check your Weight same time everyday, if you gain over 2 pounds, or you develop in leg swelling, experience more shortness of breath or chest pain, call your Primary MD immediately. Follow Cardiac Low Salt Diet and 1.5 lit/day fluid restriction.   On your next visit with your primary care physician please Get Medicines reviewed and adjusted.   Please request your Prim.MD to go over all Hospital Tests and Procedure/Radiological results at the follow up, please get all Hospital records sent to your Prim MD by signing hospital release before you go home.   If you experience worsening of your admission symptoms, develop shortness of breath, life threatening emergency, suicidal or homicidal thoughts you must seek medical attention immediately by calling 911 or calling your MD immediately  if symptoms less severe.  You Must read complete instructions/literature along with all the possible adverse reactions/side effects for all the Medicines you take and that have been prescribed to you. Take any new Medicines after you have completely understood and accpet all the possible adverse reactions/side effects.   Do not drive, operating heavy machinery, perform activities at heights, swimming or participation in water activities or provide baby sitting services if your were admitted for syncope or siezures until you have seen by Primary MD or a Neurologist and advised to do so again.  Do not drive when taking Pain medications.    Do not take more than prescribed Pain, Sleep and Anxiety Medications  Special Instructions: If you have smoked or chewed Tobacco  in the last 2 yrs please stop smoking, stop any  regular Alcohol  and or any Recreational drug use.  Wear Seat belts while driving.   Please note  You were cared for by a hospitalist during your hospital stay. If you have any questions about your discharge medications or the care you received while you were in the hospital after you are discharged, you can call the unit and asked to speak with the hospitalist on call if the hospitalist that took care of you is not available. Once you are discharged, your primary care physician will handle any further medical issues. Please note that NO REFILLS for any discharge medications will be authorized once you are discharged, as it is imperative that you return to your primary care physician (or establish a relationship with a primary care physician if you do not have one) for your aftercare needs so that they can reassess your need for medications and monitor your lab values.

## 2014-12-31 NOTE — Telephone Encounter (Signed)
Caller name:Tracey Relation to QZ:[id number] -in-law Call back [place]:  Reason for call: daughter in law states that pt is in [facility] hospital and they are trying to discharge pt today, however the hospital need a rx for the pt for marinol for eating, states hospital can't write the rx has to be done by primary doctor.States she is at [employer] and her father-in-law called her and she is not certain if the hospital has faxed anything over to dr. Etter Sjogren. Hospital was given the pt the meds while she was there but states they can not provide for discharge.

## 2014-12-31 NOTE — Care Management Important Message (Signed)
Important Message  Patient Details  Name: Ann Gonzales MRN: [id number] Date of Birth: [birth year]   Medicare Important Message Given:  Yes-second notification given    RoyalRory Percy, RN 12/31/2014, 11:32 AM

## 2015-01-03 ENCOUNTER — Telehealth: Payer: Self-pay | Admitting: *Deleted

## 2015-01-03 NOTE — Telephone Encounter (Signed)
TCM call completed with Ashlee, patient will be seen on 01/05/15

## 2015-01-03 NOTE — Telephone Encounter (Addendum)
Transition Care Management Follow-up Telephone Call  Recommendations for primary care physician for things to follow:   Check EKG for QTC, CBC, BMP, magnesium and TSH levels in 5-7 days   Date discharged? 12/31/14  How have you been since you were released from the hospital? Per daughter,  She is sleeping a lot and does not have a good appetite.  She states she is weak and mostly stays in the bed during the day. Spoke to patient and she stated "I'm a little weak, some days walking are better than others"    Do you understand why you were in the hospital? Per daughter UTI, renal failure, failure to thrive    Do you understand the discharge instructions? YES    Where were you discharged to? Home with husband, children check on her periodically    Items Reviewed:  Medications reviewed: YES   Allergies reviewed: YES   Dietary changes reviewed: heart healthy diet- per daughter patient is to drink boost/breeze between meals but her appetite is really bad  Referrals reviewed: YES    Functional Questionnaire:   Activities of Daily Living (ADLs):   She states they are independent in the following: restroom (bedside commode), dressing States they require assistance with the following: cooking, cleaning, ambulation (husband helps, children visit)    Any transportation issues/concerns?: NO- daughter will come with her to appointment     Any patient concerns? YES- in hospital she was given Marinol, but was not prescribed at discharge- could she please have Rx    Confirmed importance and date/time of follow-up visits scheduled 01/05/15  Provider Appointment booked with Dr. Etter Sjogren   Confirmed with patient if condition begins to worsen call PCP or go to the ER.  Patient was given the office number and encouraged to call back with question or concerns.  : YES

## 2015-01-05 ENCOUNTER — Other Ambulatory Visit: Payer: Self-pay | Admitting: Family Medicine

## 2015-01-05 ENCOUNTER — Encounter: Payer: Self-pay | Admitting: Family Medicine

## 2015-01-05 ENCOUNTER — Ambulatory Visit (INDEPENDENT_AMBULATORY_CARE_PROVIDER_SITE_OTHER): Payer: Medicare Other | Admitting: Family Medicine

## 2015-01-05 VITALS — BP 89/61 | HR 64 | Temp 98.0°F | Resp 16 | Ht 59.0 in | Wt 88.0 lb

## 2015-01-05 DIAGNOSIS — R197 Diarrhea, unspecified: Secondary | ICD-10-CM

## 2015-01-05 DIAGNOSIS — M069 Rheumatoid arthritis, unspecified: Secondary | ICD-10-CM | POA: Diagnosis not present

## 2015-01-05 DIAGNOSIS — K529 Noninfective gastroenteritis and colitis, unspecified: Secondary | ICD-10-CM | POA: Diagnosis not present

## 2015-01-05 DIAGNOSIS — R82998 Other abnormal findings in urine: Secondary | ICD-10-CM

## 2015-01-05 DIAGNOSIS — R296 Repeated falls: Secondary | ICD-10-CM

## 2015-01-05 DIAGNOSIS — N390 Urinary tract infection, site not specified: Secondary | ICD-10-CM

## 2015-01-05 DIAGNOSIS — E46 Unspecified protein-calorie malnutrition: Secondary | ICD-10-CM | POA: Diagnosis not present

## 2015-01-05 DIAGNOSIS — R9431 Abnormal electrocardiogram [ECG] [EKG]: Secondary | ICD-10-CM | POA: Diagnosis not present

## 2015-01-05 DIAGNOSIS — R8290 Unspecified abnormal findings in urine: Secondary | ICD-10-CM

## 2015-01-05 LAB — COMPREHENSIVE METABOLIC PANEL
ALBUMIN: 4.2 g/dL (ref 3.5–5.2)
ALT: 18 U/L (ref 0–35)
AST: 22 U/L (ref 0–37)
Alkaline Phosphatase: 39 U/L (ref 39–117)
BUN: 19 mg/dL (ref 6–23)
CHLORIDE: 110 meq/L (ref 96–112)
CO2: 20 mEq/L (ref 19–32)
Calcium: 9.8 mg/dL (ref 8.4–10.5)
Creatinine, Ser: 1.29 mg/dL — ABNORMAL HIGH (ref 0.40–1.20)
GFR: 42.88 mL/min — AB (ref >60.00)
Glucose, Bld: 77 mg/dL (ref 70–99)
POTASSIUM: 3.8 meq/L (ref 3.5–5.1)
SODIUM: 139 meq/L (ref 135–145)
Total Bilirubin: 0.4 mg/dL (ref 0.2–1.2)
Total Protein: 7.7 g/dL (ref 6.0–8.3)

## 2015-01-05 LAB — POCT URINALYSIS DIPSTICK
BILIRUBIN UA: NEGATIVE
Blood, UA: NEGATIVE
GLUCOSE UA: NEGATIVE
KETONES UA: NEGATIVE
Nitrite, UA: POSITIVE
Urobilinogen, UA: 0.2
pH, UA: 5.5

## 2015-01-05 LAB — CBC WITH DIFFERENTIAL/PLATELET
BASOS PCT: 0.9 % (ref 0.0–3.0)
Basophils Absolute: 0.1 10*3/uL (ref 0.0–0.1)
EOS PCT: 2.7 % (ref 0.0–5.0)
Eosinophils Absolute: 0.2 10*3/uL (ref 0.0–0.7)
HCT: 38.6 % (ref 36.0–46.0)
HEMOGLOBIN: 12.8 g/dL (ref 12.0–15.0)
LYMPHS ABS: 2.6 10*3/uL (ref 0.7–4.0)
Lymphocytes Relative: 41.6 % (ref 12.0–46.0)
MCHC: 33.2 g/dL (ref 30.0–36.0)
MCV: 95.3 fl (ref 78.0–100.0)
MONO ABS: 0.8 10*3/uL (ref 0.1–1.0)
Monocytes Relative: 13.1 % — ABNORMAL HIGH (ref 3.0–12.0)
Neutro Abs: 2.6 10*3/uL (ref 1.4–7.7)
Neutrophils Relative %: 41.7 % — ABNORMAL LOW (ref 43.0–77.0)
Platelets: 198 10*3/uL (ref 150.0–400.0)
RBC: 4.05 Mil/uL (ref 3.87–5.11)
RDW: 16.9 % — AB (ref 11.5–15.5)
WBC: 6.3 10*3/uL (ref 4.0–10.5)

## 2015-01-05 LAB — LIPID PANEL
CHOLESTEROL: 145 mg/dL (ref 0–200)
HDL: 36 mg/dL — AB (ref >39.00)
LDL CALC: 81 mg/dL (ref 0–99)
NonHDL: 109.34
TRIGLYCERIDES: 144 mg/dL (ref 0.0–149.0)
Total CHOL/HDL Ratio: 4
VLDL: 28.8 mg/dL (ref 0.0–40.0)

## 2015-01-05 MED ORDER — FOSFOMYCIN TROMETHAMINE 3 G PO PACK
3.0000 g | PACK | Freq: Once | ORAL | Status: AC
Start: 2015-01-05 — End: ?

## 2015-01-05 MED ORDER — DRONABINOL 2.5 MG PO CAPS: 2.5000 mg | ORAL_CAPSULE | Freq: Two times a day (BID) | ORAL | Status: AC

## 2015-01-05 NOTE — Patient Instructions (Addendum)

## 2015-01-05 NOTE — Progress Notes (Addendum)
Patient ID: Ann Gonzales, female   DOB: 1940-08-05, 74 y.o.   MRN: [id number]   Subjective:    Patient ID: Ann Gonzales, female    DOB: [birth year], 74 y.o.   MRN: [id number]  Chief Complaint  Patient presents with  . Follow-up    Hospital for 1 wk. Would like to get some rehab.    HPI Patient is in today for hosp f/u-- she was admitted 8/22 and d/c 8/25.  + dehydration, chronic diarrhea, mild cognition impairment,  Rheum arthritis----- she is unstable on her feet and walking with walker-- she refused pt at d/c from hosp.  She also refused snf rehab.    Past Medical History  Diagnosis Date  . Hypothyroidism   . Hypertension   . Osteoarthritis   . Colitis   . Rheumatoid arthritis(714.0)   . GERD (gastroesophageal reflux disease)   . Anxiety   . History of stomach ulcers   . Migraine     "sometimes 2-3 times/month" (09/02/2014)  . Stroke     "I think I had a mild stroke years ago" (09/02/2014)  . Fibromyalgia   . Chronic lower back pain   . Kidney stones   . Squamous cell carcinoma of nose   . Vocal cord paralysis   . Helicobacter pylori (H. pylori) infection     Past Surgical History  Procedure Laterality Date  . Tonsillectomy    . Abdominal hysterectomy    . Cesarean section  1960's X 1  . Laparoscopic cholecystectomy    . Excisional hemorrhoidectomy    . Lumbar disc surgery  X 2    "lower back OR; don't know what they did"  . Cataract extraction w/ intraocular lens  implant, bilateral Bilateral   . Lithotripsy    . Squamous cell carcinoma excision      "nose"  . Kyphoplasty      Dr.Brown    Family History  Problem Relation Age of Onset  . Coronary artery disease Mother   . Lung cancer Father   . Heart disease Maternal Grandmother   . Heart disease Paternal Grandmother   . Colon cancer Neg Hx     Social History   Social History  . Marital Status: Married    Spouse Name: N/A  . Number of Children: 1  . Years of Education: N/A   Occupational History  .  retired    Social History Main Topics  . Smoking status: Never Smoker   . Smokeless tobacco: Never Used  . Alcohol Use: No  . Drug Use: No  . Sexual Activity: Yes   Other Topics Concern  . Not on file   Social History Narrative    Outpatient Prescriptions Prior to Visit  Medication Sig Dispense Refill  . acetaminophen (TYLENOL) 500 MG tablet Take 1,000 mg by mouth 2 (two) times daily.     . B Complex-C (B-COMPLEX WITH VITAMIN C) tablet Take 1 tablet by mouth at bedtime.     . cephALEXin (KEFLEX) 250 MG capsule Take 250 mg by mouth at bedtime.     . cholecalciferol (VITAMIN D) 1000 UNITS tablet Take 1,000 Units by mouth at bedtime.     . CVS B-1 100 MG tablet Take 100 mg by mouth at bedtime.   0  . etanercept (ENBREL) 50 MG/ML injection Inject 50 mg into the skin once a week.    . feeding supplement (BOOST / RESOURCE BREEZE) LIQD Take 1 Container by mouth 3 (three) times daily between meals. (  Patient not taking: Reported on 01/03/2015) 90 Container 0  . folic acid (FOLVITE) 1 MG tablet Take 1 tablet (1 mg total) by mouth daily. (Patient not taking: Reported on 01/03/2015) 30 tablet 3  . hydroxychloroquine (PLAQUENIL) 200 MG tablet TAKE 1 TABLET (200 MG TOTAL) BY MOUTH DAILY. (Patient taking differently: TAKE 1 TABLET (200 MG TOTAL) BY MOUTH  every evening.) 30 tablet 3  . hyoscyamine (LEVBID) 0.375 MG 12 hr tablet Take 1 tablet (0.375 mg total) by mouth 2 (two) times daily. (Patient taking differently: Take 0.375 mg by mouth at bedtime. ) 60 tablet 0  . levothyroxine (SYNTHROID, LEVOTHROID) 75 MCG tablet Take 1 tablet (75 mcg total) by mouth daily before breakfast. 30 tablet 1  . mirtazapine (REMERON) 15 MG tablet Take 15 mg by mouth at bedtime.  5  . Nutritional Supplements (BOOST BREEZE) LIQD 530 CAL / BOTTLE--DRINK 3 A DAY BETWEEN MEALS AND BETWEEN DINNER AND BEDTIME (Patient not taking: Reported on 01/03/2015) 6399 mL 3  . ondansetron (ZOFRAN) 4 MG tablet Take 1 tablet (4 mg total) by  mouth every 8 (eight) hours as needed for nausea or vomiting. (Patient not taking: Reported on 01/03/2015) 30 tablet 0  . pantoprazole (PROTONIX) 40 MG tablet Take 40 mg by mouth at bedtime.     . potassium chloride (MICRO-K) 10 MEQ CR capsule Take 10 mEq by mouth daily.  0  . sertraline (ZOLOFT) 50 MG tablet Take 50 mg by mouth daily.  3  . diazepam (VALIUM) 5 MG tablet Take 2.5 mg by mouth at bedtime.      No facility-administered medications prior to visit.    Allergies  Allergen Reactions  . Ciprofloxacin Rash    Doesn't remember   . Leflunomide Diarrhea and Rash  . Methotrexate Rash    Mouth ulcers  . Nitrofurantoin Rash  . Penicillins Rash  . Salonpas Itching and Rash  . Sulfa Antibiotics Anxiety and Rash    hallucinations  . Tetanus Toxoid Adsorbed Itching  . Tetanus Toxoids Rash, Other (See Comments) and Itching    Review of Systems  Constitutional: Negative for fever and malaise/fatigue.  HENT: Negative for congestion.   Eyes: Negative for discharge.  Respiratory: Negative for shortness of breath.   Cardiovascular: Negative for chest pain, palpitations and leg swelling.  Gastrointestinal: Negative for nausea and abdominal pain.  Genitourinary: Negative for dysuria.  Musculoskeletal: Negative for falls.  Skin: Negative for rash.  Neurological: Negative for loss of consciousness and headaches.  Endo/Heme/Allergies: Negative for environmental allergies.  Psychiatric/Behavioral: Negative for depression. The patient is not nervous/anxious.        Objective:    Physical Exam  Constitutional: She is oriented to person, place, and time. She appears well-developed and well-nourished.  HENT:  Head: Normocephalic and atraumatic.  Eyes: Conjunctivae and EOM are normal.  Neck: Normal range of motion. Neck supple. No JVD present. Carotid bruit is not present. No thyromegaly present.  Cardiovascular: Normal rate, regular rhythm and normal heart sounds.   No murmur  heard. Pulmonary/Chest: Effort normal and breath sounds normal. No respiratory distress. She has no wheezes. She has no rales. She exhibits no tenderness.  Musculoskeletal: She exhibits no edema.  Neurological: She is alert and oriented to person, place, and time.  Psychiatric: She has a normal mood and affect. Her speech is delayed. Her speech is not slurred. She is slowed. She is not agitated, not aggressive, not hyperactive, not withdrawn, not actively hallucinating and not combative. Thought content is  not paranoid and not delusional. Cognition and memory are impaired. She does not express impulsivity or inappropriate judgment. She expresses no homicidal and no suicidal ideation. She expresses no suicidal plans and no homicidal plans. She is communicative. She exhibits abnormal recent memory. She exhibits normal remote memory. She is attentive.    BP 89/61 mmHg  Pulse 64  Temp(Src) 98 F (36.7 C) (Oral)  Resp 16  Ht 4' 11"  (1.499 m)  Wt 88 lb (39.917 kg)  BMI 17.76 kg/m2  SpO2 97% Wt Readings from Last 3 Encounters:  01/05/15 88 lb (39.917 kg)  12/29/14 97 lb 7.1 oz (44.2 kg)  12/27/14 89 lb 6.4 oz (40.552 kg)     Lab Results  Component Value Date   WBC 6.3 01/05/2015   HGB 12.8 01/05/2015   HCT 38.6 01/05/2015   PLT 198.0 01/05/2015   GLUCOSE 77 01/05/2015   CHOL 145 01/05/2015   TRIG 144.0 01/05/2015   HDL 36.00* 01/05/2015   LDLCALC 81 01/05/2015   ALT 18 01/05/2015   AST 22 01/05/2015   NA 139 01/05/2015   K 3.8 01/05/2015   CL 110 01/05/2015   CREATININE 1.29* 01/05/2015   BUN 19 01/05/2015   CO2 20 01/05/2015   TSH >90.000* 12/30/2014    Lab Results  Component Value Date   TSH >90.000* 12/30/2014   Lab Results  Component Value Date   WBC 6.3 01/05/2015   HGB 12.8 01/05/2015   HCT 38.6 01/05/2015   MCV 95.3 01/05/2015   PLT 198.0 01/05/2015   Lab Results  Component Value Date   NA 139 01/05/2015   K 3.8 01/05/2015   CO2 20 01/05/2015   GLUCOSE 77  01/05/2015   BUN 19 01/05/2015   CREATININE 1.29* 01/05/2015   BILITOT 0.4 01/05/2015   ALKPHOS 39 01/05/2015   AST 22 01/05/2015   ALT 18 01/05/2015   PROT 7.7 01/05/2015   ALBUMIN 4.2 01/05/2015   CALCIUM 9.8 01/05/2015   ANIONGAP 5 12/29/2014   GFR 42.88* 01/05/2015   Lab Results  Component Value Date   CHOL 145 01/05/2015   Lab Results  Component Value Date   HDL 36.00* 01/05/2015   Lab Results  Component Value Date   LDLCALC 81 01/05/2015   Lab Results  Component Value Date   TRIG 144.0 01/05/2015   Lab Results  Component Value Date   CHOLHDL 4 01/05/2015   No results found for: HGBA1C     Assessment & Plan:   Problem List Items Addressed This Visit    Rheumatoid arthritis - Primary   Relevant Orders   Ambulatory referral to [facility]    Other Visit Diagnoses    Protein-calorie malnutrition        Relevant Medications    dronabinol (MARINOL) 2.5 MG capsule    Other Relevant Orders    Ambulatory referral to [facility]    CBC with Differential/Platelet (Completed)    Comp Met (CMET) (Completed)    Lipid panel (Completed)    POCT urinalysis dipstick (Completed)    Clostridium difficile EIA    Stool culture    Falls frequently        Relevant Orders    Ambulatory referral to [facility]    Chronic diarrhea        Relevant Orders    CBC with Differential/Platelet (Completed)    Comp Met (CMET) (Completed)    Lipid panel (Completed)    POCT urinalysis dipstick (Completed)    Clostridium  difficile EIA    Stool culture    Diarrhea        Relevant Orders    Ambulatory referral to Gastroenterology    Abnormal EKG        Relevant Orders    EKG 12-Lead (Completed)    Urine abnormality        Relevant Orders    Urine Culture    Urine leukocytes        Relevant Orders    Urine Culture      ekg -- sinus brady I have discontinued Ms. Abrell's diazepam. I am also having her start on dronabinol. Additionally, I am having her maintain her  etanercept, pantoprazole, cholecalciferol, folic acid, hydroxychloroquine, CVS B-1, acetaminophen, BOOST BREEZE, cephALEXin, B-complex with vitamin C, hyoscyamine, ondansetron, mirtazapine, potassium chloride, sertraline, levothyroxine, and feeding supplement.  Meds ordered this encounter  Medications  . dronabinol (MARINOL) 2.5 MG capsule    Sig: Take 1 capsule (2.5 mg total) by mouth 2 (two) times daily before lunch and supper.    Dispense:  60 capsule    Refill:  [place], DO

## 2015-01-05 NOTE — Progress Notes (Signed)
Pre visit review using our clinic review tool, if applicable. No additional management support is needed unless otherwise documented below in the visit note. 

## 2015-01-07 ENCOUNTER — Other Ambulatory Visit: Payer: Self-pay | Admitting: Family Medicine

## 2015-01-07 ENCOUNTER — Telehealth: Payer: Self-pay | Admitting: Family Medicine

## 2015-01-07 NOTE — Telephone Encounter (Signed)
PA initiated through [facility]. Awaiting determination. JG//CMA

## 2015-01-07 NOTE — Telephone Encounter (Signed)
Caller name:Tracy Relationship to patient:Daughter in-law Can be [phone] Pharmacy:[facility] [address]   Reason for call:  Needing PA on marinol 2.5mg 

## 2015-01-08 LAB — URINE CULTURE

## 2015-01-08 LAB — C. DIFFICILE GDH AND TOXIN A/B
C. DIFF TOXIN A/B: NOT DETECTED
C. difficile GDH: NOT DETECTED

## 2015-01-11 LAB — STOOL CULTURE

## 2015-01-11 NOTE — Progress Notes (Unsigned)
Received call from [place] with [facility] who states she will be seeing patient 2 times weekly for 6 weeks.

## 2015-01-14 ENCOUNTER — Other Ambulatory Visit: Payer: Self-pay | Admitting: Family Medicine

## 2015-01-14 ENCOUNTER — Telehealth: Payer: Self-pay | Admitting: Family Medicine

## 2015-01-14 DIAGNOSIS — G3184 Mild cognitive impairment, so stated: Secondary | ICD-10-CM

## 2015-01-14 DIAGNOSIS — E46 Unspecified protein-calorie malnutrition: Secondary | ICD-10-CM

## 2015-01-14 NOTE — Telephone Encounter (Signed)
Caller name: Zuley Lutter Relation to pt: daughter in law Call back number: [phone]   Reason for call:  Requesting an order for a hospice consult

## 2015-01-14 NOTE — Telephone Encounter (Signed)
Please advise      KP 

## 2015-01-14 NOTE — Telephone Encounter (Signed)
i put it in

## 2015-01-14 NOTE — Telephone Encounter (Signed)
Ok to put referral in 

## 2015-01-14 NOTE — Telephone Encounter (Signed)
I need a diagnosis.     KP

## 2015-01-17 ENCOUNTER — Ambulatory Visit: Payer: Medicare Other | Admitting: Family Medicine

## 2015-01-18 ENCOUNTER — Telehealth: Payer: Self-pay | Admitting: *Deleted

## 2015-01-18 DIAGNOSIS — M069 Rheumatoid arthritis, unspecified: Secondary | ICD-10-CM | POA: Diagnosis not present

## 2015-01-18 NOTE — Telephone Encounter (Signed)
Home health certification and plan of care received via fax from [facility] for certification period: 01/08/15 - 03/08/15. Forwarded to Dr. Etter Sjogren. JG//CMA

## 2015-01-19 ENCOUNTER — Telehealth: Payer: Self-pay | Admitting: Family Medicine

## 2015-01-19 NOTE — Telephone Encounter (Signed)
Majel Homer with [facility] Ph# [phone]  Notifying Dr. Etter Sjogren that patient passed away. Pt was pronounced this morning 2015/02/11 10:38am.

## 2015-01-20 NOTE — Telephone Encounter (Signed)
To MD to make her aware.      KP 

## 2015-01-21 NOTE — Telephone Encounter (Signed)
Faxed to [facility] successfully. Sent for scanning. JG//CMA

## 2015-01-29 ENCOUNTER — Other Ambulatory Visit: Payer: Self-pay | Admitting: Family Medicine

## 2015-02-02 ENCOUNTER — Telehealth: Payer: Self-pay | Admitting: *Deleted

## 2015-02-02 DIAGNOSIS — E46 Unspecified protein-calorie malnutrition: Secondary | ICD-10-CM | POA: Diagnosis not present

## 2015-02-02 NOTE — Telephone Encounter (Signed)
Physician's orders and plan of care received via mail from [facility]. Forwarded to Dr. Etter Sjogren. JG//CMA

## 2015-02-04 ENCOUNTER — Ambulatory Visit: Payer: Medicare Other | Admitting: Internal Medicine

## 2015-02-04 NOTE — Telephone Encounter (Signed)
Signed forms mailed to [facility]. Copy sent for scanning. JG//CMA

## 2015-02-05 DEATH — deceased

## 2015-04-18 ENCOUNTER — Ambulatory Visit: Payer: Medicare Other | Admitting: Family Medicine

## 2015-06-13 ENCOUNTER — Ambulatory Visit: Payer: Medicare Other | Admitting: Neurology

## 2016-12-18 IMAGING — MR MR HEAD W/O CM
9 of 10 series · 42 of 48 positions shown · non-contrast
Comparison: Head CT 09/01/2014

CLINICAL DATA: Worsening memory loss. Dizziness, confusion Boii Aleeya
disturbance. Technologist unable to achieve venous access.

EXAM:
MRI HEAD WITHOUT CONTRAST
TECHNIQUE: Multiplanar, multiecho pulse sequences of the brain and surrounding
structures were obtained without intravenous contrast.

[Series 2: T1 · sagittal · 5.0mm · 0.45mm/px · 3 of 23 slices shown]
[im 1/23]
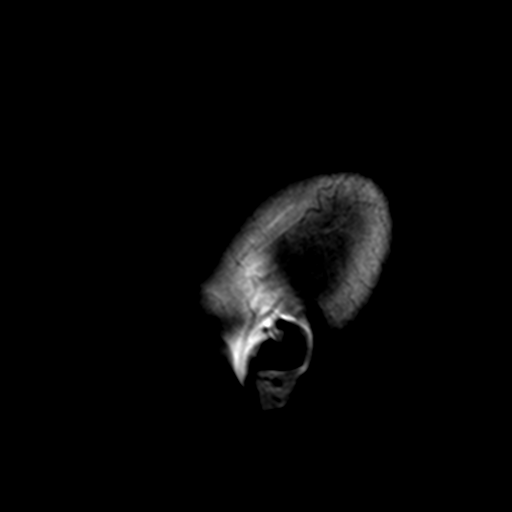
[im 12/23]
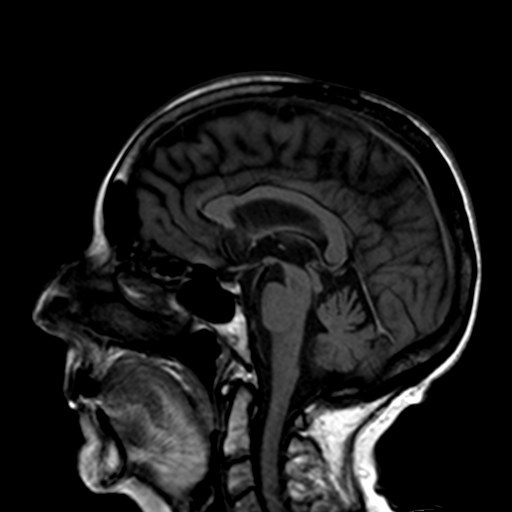
[im 23/23]
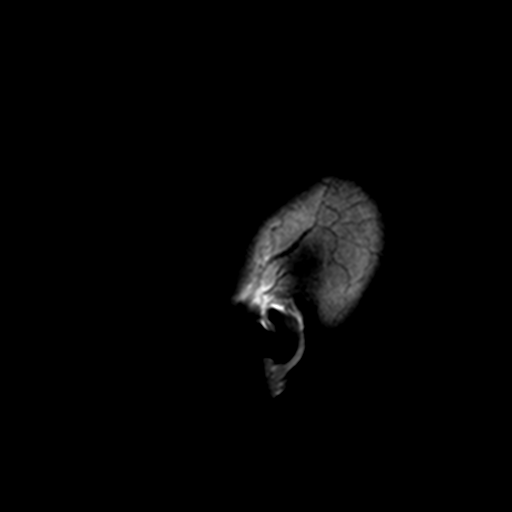

[Series 3: DWI · axial · 3.0mm · 2.19mm/px · z∈[-40,+98]mm · 10 of 89 slices shown (1 of 4)]
[im 1/89]
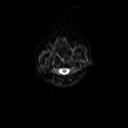
[im 10/89]
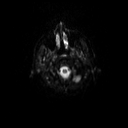
[im 20/89]
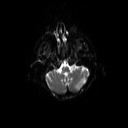
[im 30/89]
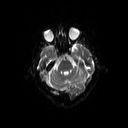
[im 40/89]
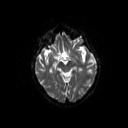
[im 49/89]
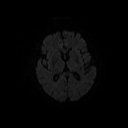
[im 59/89]
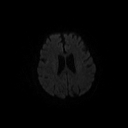
[im 69/89]
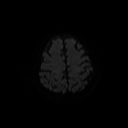
[im 79/89]
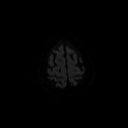
[im 89/89]
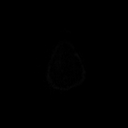

[Series 4: DWI · axial · 3.0mm · 2.19mm/px · z∈[-40,+98]mm · 5 of 45 slices shown (2 of 4)]
[im 1/45]
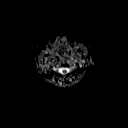
[im 12/45]
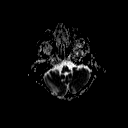
[im 23/45]
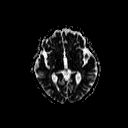
[im 34/45]
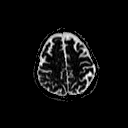
[im 45/45]
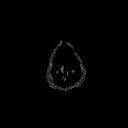

[Series 5: T2 · axial · 5.0mm · 0.45mm/px · z∈[-50,+96]mm · 2 of 23 slices shown (1 of 2)]
[im 1/23]
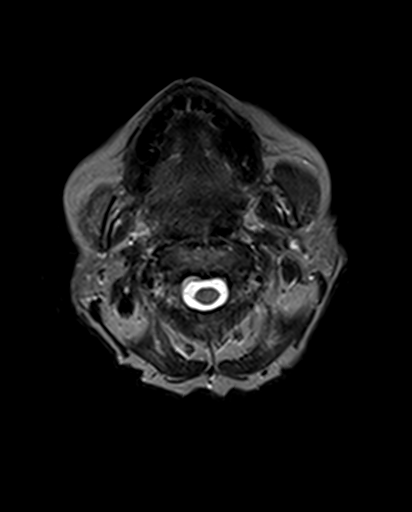
[im 23/23]
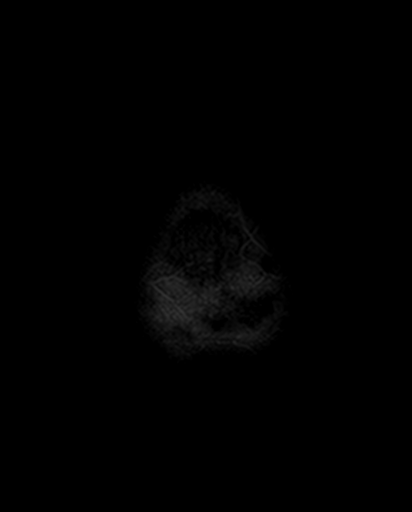

[Series 6: T2 · axial · 5.0mm · 0.45mm/px · z∈[-50,+95]mm · 2 of 23 slices shown (2 of 2)]
[im 1/23]
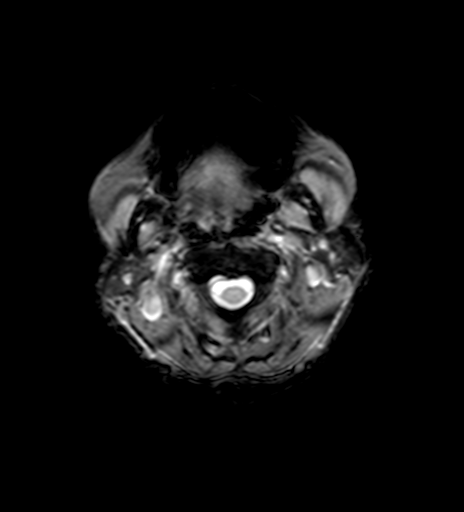
[im 23/23]
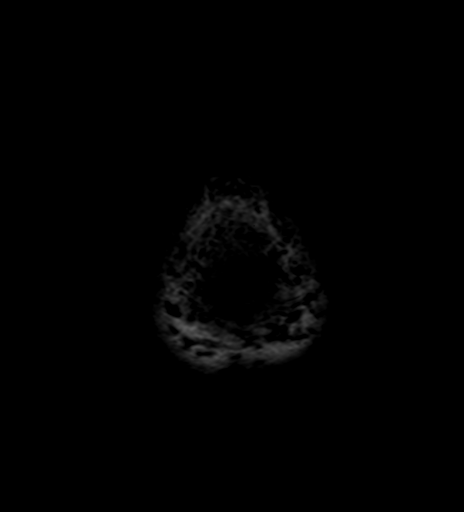

[Series 7: FLAIR · axial · 5.0mm · 0.45mm/px · z∈[-51,+95]mm · 2 of 23 slices shown]
[im 1/23]
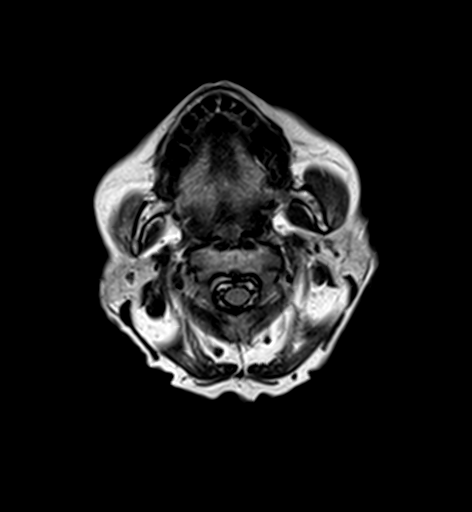
[im 23/23]
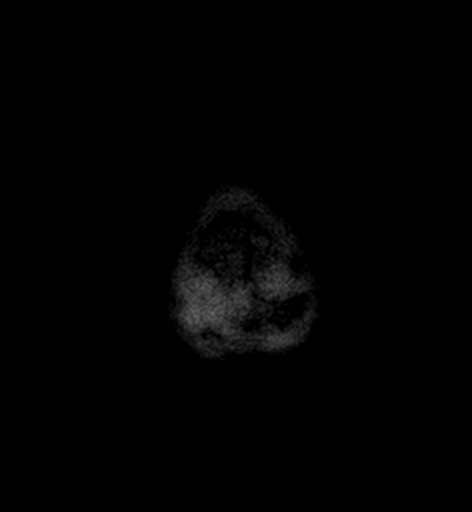

[Series 9: DWI · coronal · 3.0mm · 1.46mm/px · 10 of 90 slices shown (3 of 4)]
[im 1/90]
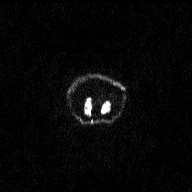
[im 10/90]
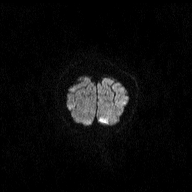
[im 20/90]
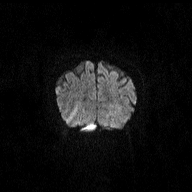
[im 30/90]
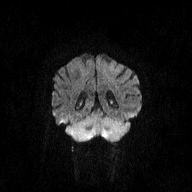
[im 40/90]
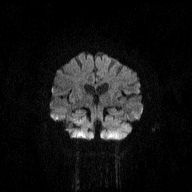
[im 50/90]
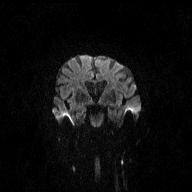
[im 60/90]
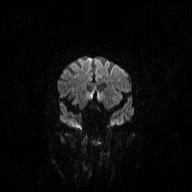
[im 70/90]
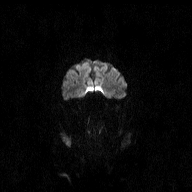
[im 80/90]
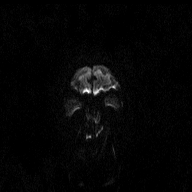
[im 90/90]
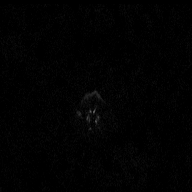

[Series 10: DWI · coronal · 3.0mm · 1.46mm/px · 5 of 45 slices shown (4 of 4)]
[im 1/45]
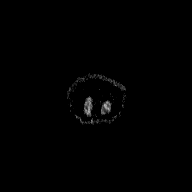
[im 12/45]
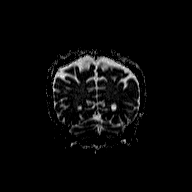
[im 23/45]
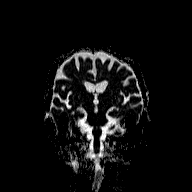
[im 34/45]
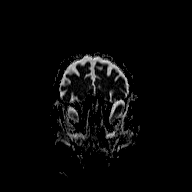
[im 45/45]
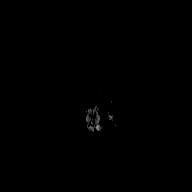

[Series 11: T2 post-contrast · coronal · 5.0mm · 0.45mm/px · 3 of 28 slices shown]
[im 1/28]
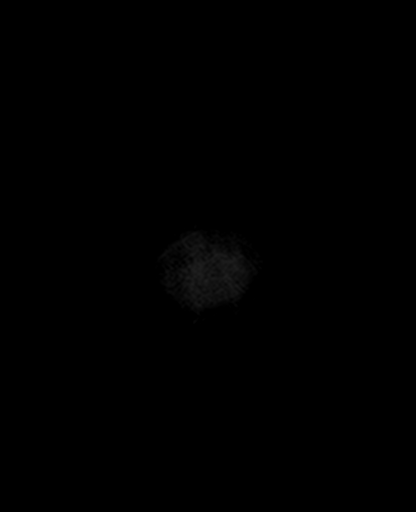
[im 14/28]
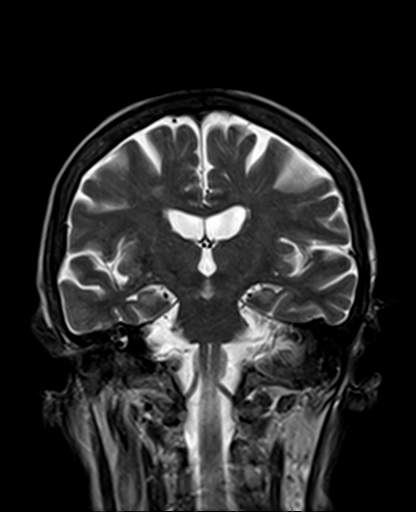
[im 28/28]
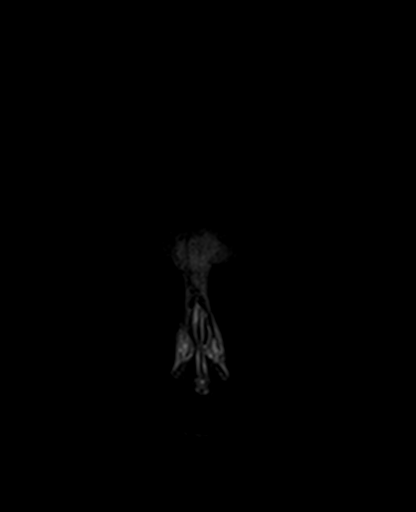

[42 of 48 positions shown; findings below may reference images not displayed]

FINDINGS: Diffusion imaging does not show any acute or subacute infarction.
The brainstem and cerebellum are normal. The cerebral hemispheres
show age related atrophy with mild chronic small-vessel change of
the white matter, not advanced for age. No cortical or large vessel
territory infarction. No mass lesion, hemorrhage, hydrocephalus or
extra-axial collection. No pituitary mass. No fluid in the sinuses,
middle ears or mastoids.
IMPRESSION: No acute or reversible finding. Ordinary mild age related volume
loss and mild small vessel change of the hemispheric white matter.
